# Patient Record
Sex: Male | Born: 1935 | Race: White | Hispanic: No | Marital: Married | Smoking: Never smoker
Health system: Southern US, Community
[De-identification: ages and names within clinical notes are randomized; demographics above are authoritative.]

## PROBLEM LIST (undated history)

## (undated) ENCOUNTER — Emergency Department: Disposition: A | Discharge status: Home / Self Care | Payer: Medicare Other

## (undated) DIAGNOSIS — E785 Hyperlipidemia, unspecified: Secondary | ICD-10-CM

## (undated) DIAGNOSIS — D649 Anemia, unspecified: Secondary | ICD-10-CM

## (undated) DIAGNOSIS — R296 Repeated falls: Secondary | ICD-10-CM

## (undated) DIAGNOSIS — G309 Alzheimer's disease, unspecified: Secondary | ICD-10-CM

## (undated) DIAGNOSIS — F0280 Dementia in other diseases classified elsewhere without behavioral disturbance: Secondary | ICD-10-CM

## (undated) DIAGNOSIS — I10 Essential (primary) hypertension: Secondary | ICD-10-CM

## (undated) DIAGNOSIS — C801 Malignant (primary) neoplasm, unspecified: Secondary | ICD-10-CM

## (undated) DIAGNOSIS — W19XXXA Unspecified fall, initial encounter: Secondary | ICD-10-CM

## (undated) DIAGNOSIS — F329 Major depressive disorder, single episode, unspecified: Secondary | ICD-10-CM

## (undated) HISTORY — PX: HIP SURGERY: SHX245

---

## 2006-10-06 ENCOUNTER — Ambulatory Visit: Payer: Self-pay | Admitting: Family Medicine

## 2006-10-20 ENCOUNTER — Ambulatory Visit: Payer: Self-pay | Admitting: Urology

## 2006-10-30 ENCOUNTER — Ambulatory Visit: Payer: Self-pay | Admitting: Family Medicine

## 2006-12-12 ENCOUNTER — Ambulatory Visit: Payer: Self-pay | Admitting: Urology

## 2007-10-19 ENCOUNTER — Ambulatory Visit: Payer: Self-pay | Admitting: Family Medicine

## 2011-11-15 ENCOUNTER — Inpatient Hospital Stay: Payer: Self-pay | Admitting: Internal Medicine

## 2011-11-15 LAB — URINALYSIS, COMPLETE
Bacteria: NONE SEEN
Glucose,UR: NEGATIVE mg/dL (ref 0–75)
Specific Gravity: 1.024 (ref 1.003–1.030)
Squamous Epithelial: 1
WBC UR: 12 /HPF (ref 0–5)

## 2011-11-15 LAB — COMPREHENSIVE METABOLIC PANEL
Albumin: 3.3 g/dL — ABNORMAL LOW (ref 3.4–5.0)
Alkaline Phosphatase: 115 U/L (ref 50–136)
BUN: 26 mg/dL — ABNORMAL HIGH (ref 7–18)
Calcium, Total: 8 mg/dL — ABNORMAL LOW (ref 8.5–10.1)
EGFR (African American): >60
EGFR (Non-African Amer.): >60
Osmolality: 290 (ref 275–301)
Potassium: 3.9 mmol/L (ref 3.5–5.1)
SGOT(AST): 29 U/L (ref 15–37)
Sodium: 142 mmol/L (ref 136–145)

## 2011-11-15 LAB — CBC WITH DIFFERENTIAL/PLATELET
Basophil %: 0.5 %
Eosinophil #: 0.1 10*3/uL (ref 0.0–0.7)
Eosinophil %: 2.5 %
Lymphocyte #: 0.8 10*3/uL — ABNORMAL LOW (ref 1.0–3.6)
Lymphocyte %: 16.4 %
MCV: 95 fL (ref 80–100)
Neutrophil #: 3.6 10*3/uL (ref 1.4–6.5)
Neutrophil %: 71.4 %
Platelet: 188 10*3/uL (ref 150–440)
RBC: 4.05 10*6/uL — ABNORMAL LOW (ref 4.40–5.90)
WBC: 5.1 10*3/uL (ref 3.8–10.6)

## 2011-11-15 LAB — ETHANOL
Ethanol %: <0.003 % (ref 0.000–0.080)
Ethanol: <3 mg/dL

## 2011-11-15 LAB — AMMONIA: Ammonia, Plasma: <25 mcmol/L (ref 11–32)

## 2011-11-15 LAB — TROPONIN I: Troponin-I: 0.13 ng/mL — ABNORMAL HIGH

## 2011-11-15 LAB — DRUG SCREEN, URINE
Cocaine Metabolite,Ur ~~LOC~~: NEGATIVE (ref ?–300)
Methadone, Ur Screen: NEGATIVE (ref ?–300)
Phencyclidine (PCP) Ur S: NEGATIVE (ref ?–25)

## 2011-11-15 LAB — PROTIME-INR: Prothrombin Time: 14 secs (ref 11.5–14.7)

## 2011-11-15 LAB — APTT: Activated PTT: 26 secs (ref 23.6–35.9)

## 2011-11-16 LAB — CK TOTAL AND CKMB (NOT AT ARMC)
CK, Total: 1410 U/L — ABNORMAL HIGH (ref 35–232)
CK, Total: 1758 U/L — ABNORMAL HIGH (ref 35–232)

## 2011-11-16 LAB — TROPONIN I
Troponin-I: 1.73 ng/mL — ABNORMAL HIGH
Troponin-I: 1.33 ng/mL — ABNORMAL HIGH

## 2011-11-18 LAB — CREATININE, SERUM: EGFR (African American): >60

## 2011-11-21 LAB — CULTURE, BLOOD (SINGLE)

## 2013-01-11 LAB — URINALYSIS, COMPLETE
Blood: NEGATIVE
Glucose,UR: NEGATIVE mg/dL (ref 0–75)
Ketone: NEGATIVE
Protein: NEGATIVE
RBC,UR: 15 /HPF (ref 0–5)
Specific Gravity: 1.023 (ref 1.003–1.030)
Squamous Epithelial: 1

## 2013-01-11 LAB — CBC
HCT: 42 % (ref 40.0–52.0)
HGB: 14.2 g/dL (ref 13.0–18.0)
MCH: 31.5 pg (ref 26.0–34.0)
MCHC: 33.8 g/dL (ref 32.0–36.0)
Platelet: 269 10*3/uL (ref 150–440)

## 2013-01-11 LAB — COMPREHENSIVE METABOLIC PANEL
Calcium, Total: 9.1 mg/dL (ref 8.5–10.1)
Co2: 28 mmol/L (ref 21–32)
Creatinine: 1.27 mg/dL (ref 0.60–1.30)
EGFR (Non-African Amer.): 53 — ABNORMAL LOW
Osmolality: 281 (ref 275–301)
Potassium: 3.7 mmol/L (ref 3.5–5.1)
SGOT(AST): 26 U/L (ref 15–37)
SGPT (ALT): 26 U/L (ref 12–78)
Total Protein: 6.9 g/dL (ref 6.4–8.2)

## 2013-01-11 LAB — CK TOTAL AND CKMB (NOT AT ARMC)
CK, Total: 274 U/L — ABNORMAL HIGH (ref 35–232)
CK-MB: 4.4 ng/mL — ABNORMAL HIGH (ref 0.5–3.6)

## 2013-01-12 ENCOUNTER — Inpatient Hospital Stay: Payer: Self-pay | Admitting: Specialist

## 2013-01-12 LAB — TSH: Thyroid Stimulating Horm: 1.76 u[IU]/mL

## 2013-01-14 LAB — URINE CULTURE

## 2013-01-15 LAB — CREATININE, SERUM
Creatinine: 0.91 mg/dL (ref 0.60–1.30)
EGFR (Non-African Amer.): >60

## 2013-06-27 LAB — CBC WITH DIFFERENTIAL/PLATELET
Basophil #: 0.1 10*3/uL (ref 0.0–0.1)
Eosinophil %: 1.4 %
Lymphocyte %: 10.6 %
MCH: 31.3 pg (ref 26.0–34.0)
Monocyte %: 10.5 %
Neutrophil #: 6.5 10*3/uL (ref 1.4–6.5)
WBC: 8.5 10*3/uL (ref 3.8–10.6)

## 2013-06-27 LAB — BASIC METABOLIC PANEL
Anion Gap: 8 (ref 7–16)
Calcium, Total: 9.4 mg/dL (ref 8.5–10.1)
Chloride: 104 mmol/L (ref 98–107)
Creatinine: 1.12 mg/dL (ref 0.60–1.30)
EGFR (Non-African Amer.): >60
Glucose: 105 mg/dL — ABNORMAL HIGH (ref 65–99)
Sodium: 138 mmol/L (ref 136–145)

## 2013-06-28 ENCOUNTER — Observation Stay: Payer: Self-pay | Admitting: Specialist

## 2013-06-28 LAB — URINALYSIS, COMPLETE
Bilirubin,UR: NEGATIVE
Blood: NEGATIVE
Glucose,UR: NEGATIVE mg/dL (ref 0–75)
Leukocyte Esterase: NEGATIVE
Ph: 5 (ref 4.5–8.0)
RBC,UR: 2 /HPF (ref 0–5)
Specific Gravity: 1.026 (ref 1.003–1.030)
WBC UR: 5 /HPF (ref 0–5)

## 2013-06-28 LAB — HEPATIC FUNCTION PANEL A (ARMC)
Albumin: 4.4 g/dL
Alkaline Phosphatase: 226 U/L — ABNORMAL HIGH
Bilirubin, Direct: 0.2 mg/dL
Bilirubin,Total: 0.8 mg/dL
SGOT(AST): 30 U/L
SGPT (ALT): 29 U/L
Total Protein: 7.9 g/dL

## 2013-06-29 LAB — URINE CULTURE

## 2014-06-19 LAB — COMPREHENSIVE METABOLIC PANEL
ALBUMIN: 3.3 g/dL — AB (ref 3.4–5.0)
ANION GAP: 5 — AB (ref 7–16)
AST: 30 U/L (ref 15–37)
Alkaline Phosphatase: 180 U/L — ABNORMAL HIGH
BUN: 22 mg/dL — AB (ref 7–18)
Bilirubin,Total: 0.9 mg/dL (ref 0.2–1.0)
Calcium, Total: 8.4 mg/dL — ABNORMAL LOW (ref 8.5–10.1)
Chloride: 108 mmol/L — ABNORMAL HIGH (ref 98–107)
Co2: 27 mmol/L (ref 21–32)
Creatinine: 1.15 mg/dL (ref 0.60–1.30)
EGFR (Non-African Amer.): >60
Glucose: 122 mg/dL — ABNORMAL HIGH (ref 65–99)
Osmolality: 284 (ref 275–301)
POTASSIUM: 4.4 mmol/L (ref 3.5–5.1)
SGPT (ALT): 25 U/L
Sodium: 140 mmol/L (ref 136–145)
Total Protein: 6.7 g/dL (ref 6.4–8.2)

## 2014-06-19 LAB — CBC
HCT: 40 % (ref 40.0–52.0)
HGB: 13.3 g/dL (ref 13.0–18.0)
MCH: 32.2 pg (ref 26.0–34.0)
MCHC: 33.2 g/dL (ref 32.0–36.0)
MCV: 97 fL (ref 80–100)
Platelet: 229 10*3/uL (ref 150–440)
RBC: 4.13 10*6/uL — ABNORMAL LOW (ref 4.40–5.90)
RDW: 13.5 % (ref 11.5–14.5)
WBC: 9.7 10*3/uL (ref 3.8–10.6)

## 2014-06-19 LAB — URINALYSIS, COMPLETE
BACTERIA: NONE SEEN
BILIRUBIN, UR: NEGATIVE
BLOOD: NEGATIVE
Glucose,UR: NEGATIVE mg/dL (ref 0–75)
NITRITE: NEGATIVE
Ph: 5 (ref 4.5–8.0)
Protein: 30
SPECIFIC GRAVITY: 1.024 (ref 1.003–1.030)
Squamous Epithelial: 1
WBC UR: 154 /HPF (ref 0–5)

## 2014-06-19 LAB — TROPONIN I

## 2014-06-19 LAB — LIPASE, BLOOD: Lipase: 154 U/L (ref 73–393)

## 2014-06-20 ENCOUNTER — Observation Stay: Payer: Self-pay | Admitting: Internal Medicine

## 2014-06-21 LAB — BASIC METABOLIC PANEL
Anion Gap: 7 (ref 7–16)
BUN: 19 mg/dL — ABNORMAL HIGH (ref 7–18)
Calcium, Total: 7.7 mg/dL — ABNORMAL LOW (ref 8.5–10.1)
Chloride: 110 mmol/L — ABNORMAL HIGH (ref 98–107)
Co2: 25 mmol/L (ref 21–32)
Creatinine: 0.98 mg/dL (ref 0.60–1.30)
EGFR (African American): >60
EGFR (Non-African Amer.): >60
Glucose: 77 mg/dL (ref 65–99)
Osmolality: 284 (ref 275–301)
Potassium: 3.9 mmol/L (ref 3.5–5.1)
Sodium: 142 mmol/L (ref 136–145)

## 2014-06-21 LAB — TSH: Thyroid Stimulating Horm: 4.25 u[IU]/mL

## 2014-06-21 LAB — URINE CULTURE

## 2014-07-10 ENCOUNTER — Ambulatory Visit: Payer: Self-pay | Admitting: Family Medicine

## 2014-07-11 ENCOUNTER — Emergency Department: Payer: Self-pay | Admitting: Emergency Medicine

## 2014-07-11 LAB — CBC WITH DIFFERENTIAL/PLATELET
BASOS PCT: 3.4 %
Basophil #: 0.2 10*3/uL — ABNORMAL HIGH (ref 0.0–0.1)
Eosinophil #: 0.1 10*3/uL (ref 0.0–0.7)
Eosinophil %: 2.3 %
HCT: 38.1 % — ABNORMAL LOW (ref 40.0–52.0)
HGB: 12.2 g/dL — ABNORMAL LOW (ref 13.0–18.0)
LYMPHS PCT: 13.2 %
Lymphocyte #: 0.7 10*3/uL — ABNORMAL LOW (ref 1.0–3.6)
MCH: 31.5 pg (ref 26.0–34.0)
MCHC: 31.9 g/dL — ABNORMAL LOW (ref 32.0–36.0)
MCV: 99 fL (ref 80–100)
Monocyte #: 0.6 x10 3/mm (ref 0.2–1.0)
Monocyte %: 10.6 %
NEUTROS PCT: 70.5 %
Neutrophil #: 3.8 10*3/uL (ref 1.4–6.5)
Platelet: 293 10*3/uL (ref 150–440)
RBC: 3.86 10*6/uL — ABNORMAL LOW (ref 4.40–5.90)
RDW: 14.1 % (ref 11.5–14.5)
WBC: 5.4 10*3/uL (ref 3.8–10.6)

## 2014-07-12 DIAGNOSIS — L03116 Cellulitis of left lower limb: Secondary | ICD-10-CM | POA: Insufficient documentation

## 2014-07-15 LAB — WOUND CULTURE

## 2014-07-17 ENCOUNTER — Encounter: Payer: Self-pay | Admitting: Surgery

## 2014-10-18 ENCOUNTER — Inpatient Hospital Stay: Payer: Self-pay | Admitting: Internal Medicine

## 2014-10-28 ENCOUNTER — Encounter
Admit: 2014-10-28 | Disposition: A | Discharge status: Home / Self Care | Payer: Self-pay | Attending: Surgery | Admitting: Surgery

## 2014-11-07 ENCOUNTER — Encounter
Admit: 2014-11-07 | Disposition: A | Discharge status: Home / Self Care | Payer: Self-pay | Attending: Surgery | Admitting: Surgery

## 2014-11-28 NOTE — Discharge Summary (Signed)
PATIENT NAME:  Kenneth Knight, Kenneth Knight MR#:  [id number] DATE OF BIRTH:  Aug 06, 1932  DATE OF ADMISSION:  01/12/2013 DATE OF DISCHARGE:  01/15/2013  For a detailed note, please take a look at the history and physical done on admission by Dr. Lenore Manner.   DISCHARGE DIAGNOSES:  1.  Altered mental status secondary to metabolic encephalopathy from urinary tract infection with underlying dementia.  2.  Urinary tract infection secondary to Escherichia coli. 3.  Dementia.  4.  Hypertension.   DIET: The patient is being discharged on a low-sodium diet.   ACTIVITY: As tolerated.   DISCHARGE FOLLOWUP:  With Dr. Jarome Lamas in the next 1 to 2 weeks.    DISCHARGE MEDICATIONS: 1.  Aricept 10 mg at bedtime. 2.  Remeron 30 mg at bedtime. 3.  Amlodipine 10 mg daily. 4.  Ceftin 250 mg b.i.d. x 5 days.   PERTINENT STUDIES:  During the hospital course are as follows: A CT scan of the head done without contrast on admission showing no acute intracranial process. A chest x-ray done on admission showing no evidence of acute cardiopulmonary disease. A urine culture to be positive for E. coli.   HOSPITAL COURSE: This is a 81 year old male with medical problems as mentioned above who presented to the hospital secondary to altered mental status and weakness.  1.  Altered mental status. Mostly cause of this was metabolic encephalopathy secondary to the UTI complicated with underlying dementia. The patient was hydrated with V fluids, given IV ceftriaxone for the UTI and the patient's mental status has significantly increased and now back to baseline. Because of his generalized weakness a physical therapy consult was obtained. After their evaluation they thought he would benefit from short-term rehab, which is where he is currently being discharged to.  2.  Urinary tract infection. This was secondary to E. coli as the urine culture indicated that. The patient was initially treated with IV ceftriaxone and is currently being  discharged on p.o. Ceftin for the next few days. 3.  Dementia. As mentioned, the patient's mental status is now back to baseline. He will continue his Aricept and his Remeron.  4.  Hypertension. The patient was maintained on amlodipine and will be discharged on that. Presently hemodynamically stable.   The patient is a FULL CODE.   TIME SPENT ON DISCHARGE:  40 minutes. ____________________________ Belia Heman. Verdell Carmine, MD vjs:sb D: 01/15/2013 13:36:26 ET T: 01/15/2013 13:55:47 ET JOB#: [id number]  cc: Belia Heman. Verdell Carmine, MD, <Dictator> Irven Easterly. Kary Kos, MD Henreitta Leber MD ELECTRONICALLY SIGNED 01/25/2013 20:35

## 2014-11-28 NOTE — H&P (Signed)
PATIENT NAME:  Kenneth Knight, Kenneth Knight MR#:  [id number] DATE OF BIRTH:  1932/03/05  DATE OF ADMISSION:  01/12/2013  PRIMARY CARE PHYSICIAN:  The patient does not specify.  He says a bunch of doctors.  However, according to records last admission his primary care physician was Dr. Maryland Pink.   REFERRING PHYSICIAN:  Dr. Marjean Donna.   CHIEF COMPLAINT:  Altered mental status, generalized weakness and grade fever.   HISTORY OF PRESENT ILLNESS:  Kenneth Knight is a 81 year old Caucasian male with history of dementia, last admission to this hospital was a year ago when he came with altered mental status and unresponsiveness.  He was also found to have urinary tract infection.  The patient has baseline dementia.  He lives with his wife who has multiple sclerosis.  The family noticed that his mental status is altered.  He is more lethargic, not himself, more confused, tendency to fall.  He has some suprapubic pain and dysuria.  The family cannot take care of him when he is in this condition.  He was brought to the hospital for evaluation and his work-up here shows evidence of urinary tract infection.  He also has low-grade fever reaching 99.2.  The patient was admitted to the hospital for further evaluation and treatment.   REVIEW OF SYSTEMS:  Difficult historian, does not give clear answers due to his altered mental status and his baseline dementia.  He admits having some dysuria, however a 10 point system review was difficult to obtain.   PAST MEDICAL HISTORY:  Last admission here was in April 2013 with decreased responsiveness and underlying urinary tract infection.  The patient also has dementia and depression.  History of prostatic cancer.   PAST SURGICAL HISTORY:  Right hip replacement and ankle surgery.   SOCIAL HABITS:  Nonsmoker.  No history of alcohol or drug abuse according to the records.   SOCIAL HISTORY:  He lives with his wife who suffers from multiple sclerosis.   FAMILY HISTORY:   Unobtainable from the patient, but according to the records his mother had stomach cancer and his father suffered from hypertension and stroke.   ADMISSION MEDICATIONS:  Donepezil 10 mg a day and Remeron 30 mg at bedtime.   PHYSICAL EXAMINATION: VITAL SIGNS:  Blood pressure 167/79, respiratory rate 20, pulse 87, temperature 99.2, O2 saturation was 96%.  GENERAL APPEARANCE:  Elderly male lying in bed in no acute distress.  He looks sleepy and slightly lethargic.  HEAD AND NECK:  No pallor.  No icterus.  No cyanosis.  EARS, NOSE, THROAT:  Ear examination revealed normal hearing, no discharge, no lesions.  Examination of the nose showed normal findings without ulcers.  No discharge, no bleeding.  Oropharyngeal examination revealed normal lips and tongue.  No oral thrush, no ulcers.  He had lost most of his teeth in the upper jaw.  EYES:  Revealed normal eyelids and conjunctivae.  Both pupils are constricted.  I could not see reactivity to light.  However, both pupils are equal in size.  NECK:  Supple.  Trachea at midline.  No thyromegaly.  No cervical lymphadenopathy.  No masses.  HEART:  Normal S1, S2.  No S3, S4.  No murmur.  No gallop.  No carotid bruits.  RESPIRATORY:  Revealed normal breathing pattern without use of accessory muscles.  No rales.  No wheezing.  ABDOMEN:  Soft without tenderness.  No suprapubic tenderness as well.  No rebound.  No rigidity.  No hepatosplenomegaly.  No hernias.  SKIN:  Revealed no ulcers.  No subcutaneous nodules.  MUSCULOSKELETAL:  No joint swelling.  No clubbing.  NEUROLOGIC:  Cranial nerves II through XII are intact.  No focal motor deficit.  PSYCHIATRIC:  The patient appears slightly confused.  He does not give me a straight answer about orientation to the place and to the time.  Mood and affect, he appears calm and cooperative.   LABORATORY FINDINGS:  CAT scan of the head showed no acute intracranial process.  EKG showed baseline artifact with a regular  rhythm, make it appear as if it is atrial fibrillation, however there are some P waves in V1, therefore I interpret the EKG as sinus tachycardia with frequent atrial premature complexes.  The heart rate is 101 per minute.  Incomplete right bundle branch block.  Nonspecific T wave abnormalities.  His serum glucose 149, BUN 20, creatinine 1.2, sodium 138, potassium 3.7.  His liver function tests were normal with a slight elevation of alkaline phosphatase at 213.  CPK total elevated at 274.  Normal troponin, less than 0.02.  CBC showed white count of 8000, hemoglobin 14, hematocrit 42, platelet count 269.  Urinalysis showed 44 white blood cells, +3 leukocyte esterase, +3 bacteria.   ASSESSMENT: 1.  Altered mental status.  2.  Early acute pyelonephritis complicating acute cystitis.  3.  Dementia.  4.  Mild rhabdomyolysis likely from his fall.  5.  Noticed elevated blood pressure reaching 268 systolic.  This needs to be monitored as the patient may have underlying hypertension.   PLAN:  We will admit the patient to the medical floor with frequent neurologic examination.  IV fluids and IV antibiotic using Rocephin.  Urine for culture and sensitivity.  I will check a TSH to make sure that he does not have underlying hypothyroidism.  Continue home medications.  Lovenox 40 mg subcutaneous once a day for deep vein thrombosis prophylaxis.  We will continue to monitor patient's response to the treatment.   Time spent in evaluating this patient took more than 55 minutes and this also includes reviewing his medical records.    ____________________________ Clovis Pu. Lenore Manner, MD amd:ea D: 01/12/2013 00:50:51 ET T: 01/12/2013 01:49:13 ET JOB#: [id number]  cc: Clovis Pu. Lenore Manner, MD, <Dictator> Mike Craze Irven Coe MD ELECTRONICALLY SIGNED 01/25/2013 3:14

## 2014-11-28 NOTE — Discharge Summary (Signed)
PATIENT NAME:  Kenneth Knight, Kenneth Knight MR#:  [id number] DATE OF BIRTH:  January 09, 1932  DATE OF ADMISSION:  06/28/2013 DATE OF DISCHARGE:  06/29/2013  For a detailed note, please take a look at the history and physical done on admission by Dr. Bridgette Habermann.     DIAGNOSES AT DISCHARGE: Is as follows: 1.  Generalized weakness and frequent falls secondary to underlying dementia.  2.  Dementia.  3.  Asymptomatic urinary tract infection.  4.  Hypertension.   DIET: The patient is being discharged on a low-sodium diet.   ACTIVITY: As tolerated.   FOLLOWUP: With Dr. Jarome Lamas next 1 to 2 weeks.  The patient is being discharged with home health, physical therapy and nursing services.   DISCHARGE MEDICATIONS: Aricept 10 mg at bedtime, Remeron 30 mg at bedtime, amlodipine 10 mg daily, and ciprofloxacin 250 mg b.i.d. x 3 days.   PERTINENT STUDIES DONE DURING THE HOSPITAL COURSE: Are as follows: A CT scan of the head done without contrast showing no acute intracranial process. A chest x-ray done on admission showing no acute cardiopulmonary disease. X-ray of the left hip showing severe osteoarthritis of the left hip. No evidence of any fracture. An x-ray of the lumbar spine showing no acute fracture. Multilevel degenerative disk disease. Most severe at L2 and L3 level.   HOSPITAL COURSE: This is a 81 year old male who presented to the hospital due to frequent falls and difficulty ambulating.   PROBLEMS: 1.  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  The likely cause of the patient's falls were a combination of dementia with severe DJD. The patient was evaluated by physical therapy and they recommended short-term rehab, although the patient was under observation and did not qualify to be placed in rehab. At this point, the patient is being discharged home with home health physical therapy and nursing services and this is to be further addressed by his primary care physician as an outpatient.  2.  Asymptomatic  urinary tract infection. The patient's urinalysis was mildly positive. He was placed on oral ciprofloxacin. He will finish treatment for the urinary tract infection.  3.  Dementia. The patient was maintained on his Aricept. He will resume that.  4.  Hypertension. The patient remained hemodynamically stable. He will continue his amlodipine at discharge.   CODE STATUS: The patient is a FULL CODE.   DISPOSITION: He is being discharged home with home health physical therapy and nursing services.   TIME SPENT: 40 minutes.  ____________________________ Belia Heman. Verdell Carmine, MD vjs:dp D: 06/29/2013 12:48:44 ET T: 06/29/2013 13:34:54 ET JOB#: [id number]  cc: Belia Heman. Verdell Carmine, MD, <Dictator> Henreitta Leber MD ELECTRONICALLY SIGNED 07/13/2013 18:33

## 2014-11-28 NOTE — H&P (Signed)
PATIENT NAME:  Kenneth Knight, Kenneth Knight MR#:  [id number] DATE OF BIRTH:  13-Apr-1932  DATE OF ADMISSION:  06/27/2013  REFERRING PHYSICIAN: Verdia Kuba. Paduchowski, MD  PRIMARY CARE PHYSICIAN: Dr. Kary Kos.   CHIEF COMPLAINT: Brought in by family for recurrent falls and weakness.   HISTORY OF PRESENT ILLNESS: The patient is a pleasant 81 year old male with history of dementia, hypertension, recurrent UTIs, who was brought in by family after the patient has been noted to have increased weakness and several falls. The patient usually is able to ambulate, but recently has been having decreased ambulation, and the patient has been having some falls. Unfortunately, there is no family at bedside. Given the dementia, the patient is not a reliable historian. The patient denies having any fevers or chills, but states that he has some dysuria and some leg pain. While in the ER, he had left hip complete x-rays which shows no fractures.  He also had lumbar spine. Again, there are no fractures, but there is degenerative disease. There is no fever and there is no leukocytosis. Hospitalist services were contacted for further evaluation and management.   PAST MEDICAL HISTORY: Per chart; dementia, prostate cancer, depression, hypertension, multiple UTIs in the past.    SURGICAL HISTORY: Right hip replacement, ankle surgery.   SOCIAL HISTORY: Per chart, no smoking, no alcohol or drug use. Lives with his daughter.    OUTPATIENT MEDICATIONS: Unable to obtain, as patient does not know.  REVIEW OF SYSTEMS:  Unable to fully obtain given the patient's dementia.   PHYSICAL EXAMINATION: VITAL SIGNS: Temperature on arrival 98.2, pulse rate 80, respiratory rate 20, blood pressure 135/67, O2 sat 96% on room air.  GENERAL: The patient is an elderly male lying in bed, no obvious distress.   HEENT: Normocephalic, atraumatic. Pupils are equal and reactive. Anicteric sclerae, extraocular  muscles intact. Moist mucous membranes.  NECK:  Supple. No thyroid tenderness. No cervical lymphadenopathy.  CARDIOVASCULAR: S1, S2, irregularly irregular. No murmurs, rubs or gallops.  LUNGS: Clear to auscultation without wheezing, rhonchi or rales. The patient has poor effort.  ABDOMEN: Soft, nontender, nondistended. Positive bowel sounds in all quadrants.  EXTREMITIES: No significant lower extremity edema.  SKIN: The patient has an area of small bruising and ecchymosis in the left flank area.  NEUROLOGIC: The patient appears to have cranial nerves Ii through XII grossly intact.  Strength is 5 out of 5 in all extremities. Sensation is subjectively intact to light touch.  PSYCHIATRIC: Awake, awake, alert, oriented to the hospital but not to date, person.  SKIN: No obvious rashes otherwise.  ;LABS AND IMAGING:  Left hip complete' severe osteoarthritis of the left hip. No left-sided pelvic fracture or left hip fracture, extensive right hemi-pelvic deformity related to prior fractures and reconstruction. Urinalysis: The patient does have 5 WBC, trace bacteria, no nitrites or leukocyte esterase. No white count, hemoglobin and platelets are stable. Troponins negative. LFTs show alk phos of 226, otherwise within normal limits. BUN 24, creatinine 1.12, sodium 138, potassium 4.3.   EKG: Normal sinus rhythm, incomplete right bundle branch block, some nonspecific ST-T wave changes. No acute ST elevations or depressions.   ASSESSMENT AND PLAN: We have a 81 year old with dementia, hypertension, multiple urinary tract infections who comes in with frequent falls and weakness. The patient has no significant lab abnormalities, but has some subjective dysuria. This could be urinary tract infection, which the patient has remote history of multiple urinary tract infections in the past. The patient has trace bacteria. We would  follow with a urine culture and start the patient on ceftriaxone for now, start the patient on some gentle fluids and obtain a physical therapy  consult. Bring him in for observation overnight. The patient has no significant fever or leukocytosis, otherwise. X-rays of the left hip and pelvis does not show any acute fractures. Will start the patient on some Tylenol, heparin for deep vein thrombosis prophylaxis and pantoprazole. We would confirm outpatient medications before resuming them. Would put him on sodium restricted diet for now, put him on telemetry to look for any significant arrhythmias, if any.  The patient is a Full Code for now, as we cannot determine CODE STATUS.          Total Time Spent: 35 minutes.    ____________________________ Vivien Presto, MD sa:NTS D: 06/28/2013 04:28:25 ET T: 06/28/2013 04:54:02 ET JOB#: [id number]  cc: Vivien Presto, MD, <Dictator> Vivien Presto MD ELECTRONICALLY SIGNED 07/15/2013 20:00

## 2014-11-28 NOTE — Discharge Summary (Signed)
PATIENT NAME:  Kenneth Knight, Kenneth Knight MR#:  [id number] DATE OF BIRTH:  October 19, 1931  DATE OF ADMISSION:  06/28/2013 DATE OF DISCHARGE:  06/30/2013  ADDENDUM:  Please take a look at the detailed discharge summary done by me on November 22nd. This is just a quick addendum to the previous discharge summary. The patient was admitted to the hospital for frequent falls. The patient was evaluated by physical therapy and they did not think the patient needed short-term rehab. He was, therefore, discharged home with home health services. This was discussed with the patient's daughter and she was in agreement. The patient had an incidental asymptomatic urinary tract infection. He was discharged on oral ciprofloxacin for that. The patient was discharged with home health services as mentioned. This is a quick addendum to the discharge summary done by me on November 22nd.   TIME SPENT: 25 minutes.  ____________________________ Belia Heman. Verdell Carmine, MD vjs:cs D: 07/14/2013 12:57:32 ET T: 07/14/2013 20:19:09 ET JOB#: [id number]  cc: Belia Heman. Verdell Carmine, MD, <Dictator> Henreitta Leber MD ELECTRONICALLY SIGNED 07/18/2013 14:30

## 2014-11-29 NOTE — Discharge Summary (Signed)
PATIENT NAME:  Kenneth Knight, Kenneth Knight MR#:  [id number] DATE OF BIRTH:  [birth year]  DATE OF ADMISSION:  06/20/2014 DATE OF DISCHARGE:  06/22/2014  DISCHARGE DIAGNOSES:  1.  Multiple falls are due to ambulatory dysfunction.  2.  Dementia.  3.  Hypertension.  4   DISCHARGE MEDICATIONS:  1.  Aricept 10 mg daily.  2.  Mirtazapine30 mg at bedtime.  3.  Amlodipine 10 mg p.o. daily. 4.  Lexapro 500 mg p.o. b.i.d.  5.  The patient given Cipro for about 5 days.  DISCHARGE DISPOSITION: Discharged home with home health.   HOSPITAL COURSE: An 81 year old male patient brought in because of multiple falls for about 6 times in the last 2 days. Head CT was unremarkable. The patient has severe dementia and he was taking Aricept and mirtazapine at home and on the admission day, his urine was positive for leukocyte esterase and 154 WBC.  The patient's vitals were normal except blood pressure was slightly low at 97/57.  So, he was admitted for ambulatory dysfunction, urinary tract infection. He was given IV fluids for his hypotension.  The patient was seen by physical therapy who recommended home physical therapy.  The patient received IV fluids and IV antibiotics.  The patient was placed on observation status.  Urine culture did not show any growth.  His kidney function was normal. White count was normal. The patient was discharged home with home health and CBC was normal and electrolytes were normal.    PHYSICAL EXAMINATION: On the day of discharge: CARDIOVASCULAR:   S1, S2 regular.  LUNGS: Clear to auscultation. NEUROLOGICAL:  The patient's mental status; he was awake and oriented at times but sometimes he feels he has some memory gaps. EXTREMITIES: No extremity edema. VITAL SIGNS:  Temperature 97.7, heart rate 71, blood pressure 113/71, saturations 99% on room air.   TIME SPENT: More than 30 minutes.     PRIMARY CARE PHYSICIAN:  Lovie Macadamia, MD      ____________________________ Epifanio Lesches,  MD sk:DT D: 06/27/2014 14:01:20 ET T: 06/27/2014 16:05:30 ET JOB#: [id number]  cc: Epifanio Lesches, MD, <Dictator> _____ Irven Easterly. Kary Kos, MD    Epifanio Lesches MD ELECTRONICALLY SIGNED 07/07/2014 17:43
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
## 2014-11-30 NOTE — H&P (Signed)
PATIENT NAME:  Kenneth Knight, FIELDHOUSE MR#:  [id number] DATE OF BIRTH:  [birth year]  DATE OF ADMISSION:  11/15/2011  PRIMARY CARE PHYSICIAN: Dr. Kary Kos   CHIEF COMPLAINT: Unresponsiveness and syncope with collapse.   HISTORY OF PRESENT ILLNESS: This is a 81 year old male brought in by EMS as he was found unresponsive and also due to syncope. Patient himself has underlying dementia therefore most of the history obtained from the son at bedside. As per the son, patient was awake and alert and inside the house when he left around 1:00 this afternoon. When he came back a little after 3:00 his mother told him that his father was outside unresponsive. Son tried to arouse his father although he would not wake up and therefore he called EMS. Upon EMS arrival patient was still unresponsive. Patient was then urgently brought to the ER for further evaluation. When patient presented to triage his temperature rectally was as high as 104 although he was hemodynamically stable. Patient after receiving some IV fluids and being on a cooling blanket his mental status has improved but still remains somewhat lethargic and groggy. He was noted to have a slightly elevated troponin. Hospitalist service was then contacted for further treatment and evaluation.   REVIEW OF SYSTEMS: Currently unobtainable given the patient's underlying dementia.   PAST MEDICAL HISTORY:  1. Osteoarthritis. 2. Dementia. 3. History of prostate cancer.  4. Depression.   ALLERGIES: No known drug allergies.   SOCIAL HISTORY: No smoking. No alcohol abuse. No illicit drug abuse. Lives with his wife.   FAMILY HISTORY: Father with hypertension and stroke. Mother with stomach cancer.   PAST SURGICAL HISTORY:  1. Right hip replacement. 2. Ankle surgery.   CURRENT MEDICATION: Remeron 30 mg at bedtime.   PHYSICAL EXAMINATION:  VITAL SIGNS: Patient's vital signs are noted to be: Temperature rectally was initially 104, now it is 98, pulse 105  respirations 22, blood pressure 144/69, sats 96% on 2 liters nasal cannula.   GENERAL: He is a lethargic-appearing male but in no apparent distress.   HEENT: He is atraumatic, normocephalic. His extraocular muscles are intact. Pupils equal, reactive to light. Sclerae anicteric. No conjunctival injection. No pharyngeal erythema.   NECK: Supple. No jugular venous distention. No bruits. No lymphadenopathy. No thyromegaly.   HEART: Tachycardic, regular. No murmurs, no rubs, no clicks.   LUNGS: Clear to auscultation bilaterally. No rales, no rhonchi, no wheezes.   ABDOMEN: Soft, flat, nontender, nondistended. Has good bowel sounds. No hepatosplenomegaly appreciated.   EXTREMITIES: No evidence of any cyanosis, clubbing, or peripheral edema. Has +2 pedal and radial pulses bilaterally.   NEUROLOGIC: He is alert, awake, oriented x1 otherwise moves all extremities spontaneously. He is globally weak. Difficult to do a full neurological exam given his baseline underlying dementia.   SKIN: Moist, warm. He does have an area on the right side of his knee which is somewhat red but not tender to touch. Likely secondary to the pressure he had when he was lying on the ground.    LYMPHATIC: There is no cervical or axillary lymphadenopathy.   LABORATORY, DIAGNOSTIC, AND RADIOLOGICAL DATA: Serum glucose 139, BUN 26, creatinine 1.2, sodium 142, potassium 3.9, chloride 110, bicarbonate 23. LFTs are within normal limits. Troponin 0.13. Urine toxicology negative. White cell count 5.1, hemoglobin 12.9, hematocrit 38.6, platelet count 188, prothrombin time 14.1, INR 1. Urinalysis showed leukocyte esterase +1 with 12 white cells but no bacteria seen. Patient did have a chest x-ray done which showed no acute cardiopulmonary  disease. He also had a CT scan of the head and CT scan of the cervical spine without contrast showing no evidence of acute intracranial process, only fracture.   ASSESSMENT AND PLAN: This is a  81 year old male with past medical history of dementia, osteoarthritis, depression and prostate cancer presents to the hospital as he was found unresponsive and also noted to be hyperthermic.  1. Unresponsiveness/questionable syncope with collapse. The syncope was actually not witnessed but he was found down. He was probably down for about an hour as per the son. No evidence of seizure-type activity. No postictal state. His CT scans of the cervical spine and head are negative. There is no obvious trauma noted on physical exam. I suspect this could possibly be cardiogenic syncope given his elevated troponin. I will go ahead and observe him on telemetry. Follow serial cardiac markers. Get a two-dimensional echo, also a carotid duplex. Will also check orthostatics. Will also continue IV fluids for now.  2. Hyperthermia. This is probably related to the fact that he was found down while he was outside in the sun. His temperature has significantly improved with the cooling blanket and IV fluids. Will follow his fever curve. Do not suspect an infectious source.  3. Elevated troponin. The patient currently is chest pain free but is a poor historian. He has no EKG changes. I will cycle his cardiac markers and observe him on tele and get a two-dimensional echo.  4. Urinary tract infection. Based on the urinalysis this is a slightly positive urinalysis. I will go ahead and empirically start him on p.o. Cipro for now.  5. Dementia with depression. Based on previous notes from the [facility] by Dr. Kary Kos looks like patient has been having balance problems and frequent falls. I will go ahead and get a physical therapy consult to assess his mobility.  6. CODE STATUS: Patient is a FULL CODE.   TIME SPENT: 50 minutes.  ____________________________ Belia Heman. Verdell Carmine, MD vjs:cms D: 11/15/2011 20:00:53 ET T: 11/16/2011 06:00:05 ET JOB#: [id number]  cc: Belia Heman. Verdell Carmine, MD, <Dictator> Irven Easterly. Kary Kos, MD Henreitta Leber MD ELECTRONICALLY SIGNED 11/18/2011 14:49

## 2014-11-30 NOTE — Discharge Summary (Signed)
PATIENT NAME:  Kenneth Knight, Kenneth Knight MR#:  [id number] DATE OF BIRTH:  1932-03-01  DATE OF ADMISSION:  11/15/2011 DATE OF DISCHARGE:  11/18/2011  DISCHARGE DIAGNOSES:  1. Unresponsiveness/possible syncope with collapse of unknown etiology with negative cardiac work-up and Myoview been negative.  2. Hyperthermia, resolved, likely related to being found down for awhile in the sun. Temperature improved with cooling blanket.  3. Elevated troponin could be due to supply/demand ischemia.  4. Urinary tract infection, treated with antibiotics.  5. Dementia/depression. Based on Dr. Barbarann Ehlers note, the patient has been having frequent falls at home.  6. Physical therapy recommended home health.   SECONDARY DIAGNOSES:  1. Osteoarthritis.  2. Dementia.  3. History of prostate cancer.  4. Depression.   CONSULTATIONS:  1. Physical Therapy. 2. Serafina Royals, MD - Cardiology.  PROCEDURES/DATA: Myoview on 11/18/2011 was normal, ejection fraction of 61%.   Bilateral carotid Doppler's on 11/16/2011 showed no hemodynamically significant stenosis. Antegrade flow in both vertebrals.   2-D echocardiogram on 11/16/2011 showed normal LV systolic function. Ejection fraction of 65%. Moderately dilated left atrium. Mild to moderately dilated right atrium. Mild to moderate mitral regurgitation. Mild tricuspid regurgitation. Mild aortic root dilatation.   CT scan of the head without contrast, on 11/15/2011, showed no acute intracranial process.   CT scan of cervical spine without contrast on 11/15/2011 showed no acute osseous injury of the cervical spine.   Chest x-ray on 11/15/2011 showed no acute cardiopulmonary disease.   MAJOR LABORATORY PANEL: Urinalysis on admission showed 12 WBCs, 1+ leukocyte esterase, and no bacteria.   Blood cultures x2 were negative.   HISTORY AND SHORT HOSPITAL COURSE: The patient is a 81 year old male with the above-mentioned medical problems who was admitted for  unresponsiveness/possible syncope with collapse. He underwent complete neurological evaluation including CT scan of the head and cervical spine, carotid Dopplers, and 2-D echo which were all within normal limits. He was found to have an elevated troponin which was thought to be due to supply/demand ischemia. He was treated with ciprofloxacin for urinary tract infection. Please see Dr. Edward Jolly dictated history and physical for further details. Physical therapy consultation was obtained as the patient was found to have recurrent fall at home and was recommended home health. The patient was also evaluated by cardiology, Dr. Nehemiah Massed, who recommended Myoview, which was obtained and was negative. The patient ambulated well. He was also found to have some hypothermia on admission which was resolved while in the hospital. His discharge plan was discussed with the patient's family (his daughter) who was agreeable with the discharge plan.   PERTINENT PHYSICAL EXAMINATION:   VITAL SIGNS: On the date of discharge, his temperature was 98.4, heart rate 87 per minute, respirations 20 per minute, blood pressure 117/72 mmHg, and he was saturating 93% on room air.   CARDIOVASCULAR: S1 and S2 normal. No murmurs, rubs, or gallops.   LUNGS: Clear to auscultation bilaterally. No wheezing, rales, rhonchi, or crepitation.   ABDOMEN: Soft, benign.   NEUROLOGIC: Nonfocal examination. All other physical examination remained at baseline.   DISCHARGE MEDICATIONS:  1. Donepezil 10 mg p.o. at bedtime.  2. Mirtazapine 30 mg p.o. at bedtime.  3. Metoprolol 25 mg p.o. daily.  4. Aspirin 81 mg p.o. daily.   DISCHARGE DIET: Low sodium.   DISCHARGE ACTIVITY: As tolerated.   DISCHARGE INSTRUCTIONS AND FOLLOW-UP: The patient was instructed to follow-up with his primary care physician, Dr. Kary Kos, in 1 to 2 weeks, with cardiology, Dr. Nehemiah Massed, in 2 to  3 weeks, and with [facility] in 2 to 4 weeks. He will get  home health with physical therapy and nursing and nursing aide which has been set up by care management.   TOTAL TIME DISCHARGING THIS PATIENT: 55 minutes. ____________________________ Lucina Mellow. Manuella Ghazi, MD vss:slb D: 11/22/2011 11:02:08 ET T: 11/22/2011 17:31:06 ET JOB#: [id number]  cc: Daylene Vandenbosch S. Manuella Ghazi, MD, <Dictator> Irven Easterly. Kary Kos, MD Corey Skains, MD [facility] Lucina Mellow [facility] MD ELECTRONICALLY SIGNED 11/23/2011 9:14

## 2014-11-30 NOTE — Consult Note (Signed)
General Aspect This is a 81 yo male with h/o dementia, arthritis, depression, frequent falls that is being evaluated for positive troponin and syncope. Pt collasped in his yard on concrete and was unresponsive and exposed to the sun for around an hour. He had hyperthermia with fever of 104 degrees on arrival to ER. He temp did normalize with a cooling blanket. Troponin peached at1.73. Echo with normal ef, atria enlargement and valvular insufficiency. EKG revealed IRBBB with pc's. No acute changes. BNP elevated without clinical signs of heart failure. Pt is demented but  contibuted to the interview appropriately with most of the history from the daughter. No history of cad, no recent chest pain or dyspnea. Nsr on monitor without arrythmia. No bp meds on board at home to explain a drop in pressure. Mild UTI found.   Physical Exam:   GEN well developed, no acute distress    HEENT pink conjunctivae    NECK supple    RESP normal resp effort  clear BS    CARD Regular rate and rhythm    ABD denies tenderness  normal BS    EXTR negative edema    SKIN skin turgor decreased    NEURO cranial nerves intact, motor/sensory function intact    PSYCH alert   Review of Systems:   Subjective/Chief Complaint Syncope    Psychiatric: h/o dementia    Review of Systems: All other systems were reviewed and found to be negative   Lab:  09-Apr-13 17:10    pH (ABG) 7.40   PCO2 36   PO2 66   FiO2 28   Base Excess -2.0   HCO3 22.3   O2 Saturation 95.8   Patient Temp (ABG) 37.0   Radiology Results: XRay:    09-Apr-13 17:12, Chest Portable Single View   Chest Portable Single View    REASON FOR EXAM:    altered mental status, hyperthermia  COMMENTS:       PROCEDURE: DXR - DXR PORTABLE CHEST SINGLE VIEW  - Nov 15 2011  5:12PM     RESULT: Comparison: None    Findings:     Single portable AP chest radiograph is provided. There are low lung   volumes. There bilateral prominent interstitial  markings likely secondary   to hypoinflation. There is no focal parenchymal opacity, pleural   effusion, or pneumothorax. Normal cardiomediastinal silhouette. The   osseous structures are unremarkable.    IMPRESSION:     No acute disease of the chest.    Dictation Site: 3          Verified By: Jennette Banker, M.D., MD  Cardiology:    10-Apr-13 09:43, Echo Doppler   Echo Doppler    Interpretation Summary    Left ventricular systolic function is normal.ef 65% The left atrium   is moderately dilated. The right atrium is mild to moderately   dilated. There is mild to moderate mitral regurgitation. There is   mild tricuspid regurgitation. Mild aortic root dilatation.    Procedure:    A two-dimensional transthoracic echocardiogram with color flow and   Doppler was performed.    Left Ventricle    The left ventricle is normal in size.    Left ventricular systolic function is normal.    Right Ventricle    The right ventricle is normal in size and function.    Atria    The left atrium is moderately dilated.    The right atrium is mild to moderately  dilated.    Mitral Valve    The mitral valve leaflets appear thickened, but open well.    There is mild to moderate mitral regurgitation.    Tricuspid Valve    The tricuspid valve is normal in structure and function.    There is mild tricuspid regurgitation.    Aortic Valve    The aortic valve is normal in structure and function.    No aortic regurgitation is present.    Pulmonic Valve    The pulmonic valve is not well seen, but is grossly normal.    Great Vessels    Mild aortic root dilatation.    Pericardium/Pleural    No pericardial effusion.    MMode 2D Measurements and Calculations    RVDd: 4.1 cm    IVSd: 1.1 cm    LVIDd: 4.1 cm    LVIDs: 2.9 cm    LVPWd: 1.3 cm    FS: 28 %    EF(Teich): 55 %    Ao root diam: 3.8 cm    LA dimension: 4.5 cm    LVOT diam: 2.3 cm    Doppler Measurements and Calculations     MV E point:60 cm/sec    MV A point: 61 cm/sec    MV E/A: 0.98     MV dec time: 0.28 sec    Ao V2 max: 108 cm/sec    Ao max PG: 5.0 mmHg    AVA(V,D): 2.5 cm2    LV max PG: 2.0 mmHg    LV V1 max: 66 cm/sec    PA V2 max: 75 cm/sec    PA max PG: 2.0 mmHg    TR Max vel: 201 cm/sec    TR Max PG: 16 mmHg    RVSP: 21 mmHg    RAP systole: 5.0 mmHg    Reading Physician: Serafina Royals   Sonographer: Sherrie Sport  Interpreting Physician:  Serafina Royals,  electronically signed on   11-16-2011 17:13:29  Requesting Physician: Serafina Royals    No Known Allergies:   Vital Signs/Nurse's Notes: **Vital Signs.:   11-Apr-13 11:57   Vital Signs Type Routine   Temperature Temperature (F) 98.2   Celsius 36.7   Temperature Source oral   Pulse Pulse 72   Pulse source per Dinamap   Systolic BP Systolic BP 786   Diastolic BP (mmHg) Diastolic BP (mmHg) 70   Mean BP 91   BP Source Dinamap   Pulse Ox % Pulse Ox % 96   Pulse Ox Activity Level  At rest     Impression 1.Syncope with prolonged unconsciousness, hyperthermia with elevated troponin consistent with myocardial necrosis, but unsure if this represents Type I or TYpe 2, demand ischemia form prolonged unconsious/hyperthermia. Will plan on lexiscan myoview tomorrow to further differentiate. 2. Continue telemetry to watch afor arrthymias that may have contibuted to syncope. 3. Further evaluation past myoview results are known.  Pt seen in collaboration with Dr. Nehemiah Massed who agrees with above plan.   Electronic Signatures: Roderic Palau (NP)  (Signed 11-Apr-13 14:15)  Authored: General Aspect/Present Illness, History and Physical Exam, Review of System, Labs, Radiology, Allergies, Vital Signs/Nurse's Notes, Impression/Plan   Last Updated: 11-Apr-13 14:15 by Roderic Palau (NP)

## 2014-12-07 NOTE — Consult Note (Signed)
Brief Consult Note: Diagnosis: left buttock cellulitis, dementia.   Patient was seen by consultant.   Consult note dictated.   Recommend further assessment or treatment.   Comments: I see no indication for acute surgical intervention at present will re-assess in am.  Electronic Signatures: Sherri Rad (MD)  (Signed 14-Mar-16 14:02)  Authored: Brief Consult Note   Last Updated: 14-Mar-16 14:02 by Sherri Rad (MD)
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
## 2014-12-07 NOTE — Discharge Summary (Signed)
PATIENT NAME:  Kenneth Knight, Kenneth Knight MR#:  [id number] DATE OF BIRTH:  01/28/1932  DATE OF ADMISSION:  10/18/2014 DATE OF DISCHARGE:  10/21/2014  ADMITTING DIAGNOSIS: Cellulitis involving his left gluteal region.   DISCHARGE DIAGNOSES:  1. Left gluteal cellulitis with associated small abscess with spontaneous drainage.  2. Dementia.  3. Generalized weakness and deconditioning.   CONSULTANTS: Surgery, Dr. Marina Gravel.   PERTINENT AND EVALUATIONS: Admitting glucose 172, BUN 22, creatinine 1.01, sodium 137, potassium 4.3, chloride 104, CO2 of 25, calcium 9.1. LFTs were normal except alkaline phosphatase of 189, WBC on admission 12.2, hemoglobin 12.9, platelet count was 285,000. Blood cultures x2: No growth. Ultrasound of the left buttock shows findings consistent with cellulitis. No abscess identified.   HOSPITAL COURSE: Please refer to H and P done by the admitting physician. The patient is an 81 year old white male who, was brought to the hospital with pain in his left buttocks and erythema on his buttocks. The patient was brought to the ED and was noted to have cellulitis involving his left buttocks and was admitted and placed on antibiotics. The patient continued to have erythema, however that is slowly improved. He was seen by surgery. They did not feel that there was anything that needed to be drained. He is very deconditioned and he has generalized weakness. At this point, he is being arranged to discharge to a skilled nursing facility.   DISCHARGE MEDICATIONS: Benazepril 10 at bedtime, mirtazapine 30 at bedtime, amlodipine 10 daily, Namenda XR 1 tab p.o. daily, vitamin B12 1000 mcg daily, vitamin E 400 international units daily, vitamin D3, 2000 international units daily, vitamin C 1000 mg daily, acetaminophen 2 tabs every 4 hours p.r.n., Collagenase apply topically to the wound, clindamycin 300 mg 1 tab p.o. every 6 hours x5 days, amoxicillin/clavulanic 875/125 mg 1 tab p.o. every 12 hours x5 days.    DRESSING: Cleanse ulcer left ischium with normal saline and pat gently dry. Apply Santyl to the wound bed. Top with normal saline moist dressing secured with ABD pad, change daily.   DIET: Low sodium.   ACTIVITY: As tolerated.   FOLLOWUP: With the MD at the skilled nursing facility in 1 to 2 weeks.   TIME SPENT: 35 minutes.  ____________________________ Lafonda Mosses Posey Pronto, MD shp:ap D: 10/21/2014 11:03:31 ET T: 10/21/2014 11:34:01 ET JOB#: [id number]  cc: Harlow Carrizales H. Posey Pronto, MD, <Dictator> Alric Seton MD ELECTRONICALLY SIGNED 10/22/2014 14:22

## 2015-06-07 ENCOUNTER — Emergency Department: Payer: Medicare Other

## 2015-06-07 ENCOUNTER — Emergency Department
Admission: EM | Admit: 2015-06-07 | Discharge: 2015-06-07 | Disposition: A | Discharge status: Home / Self Care | Payer: Medicare Other | Attending: Emergency Medicine | Admitting: Emergency Medicine

## 2015-06-07 DIAGNOSIS — S7011XA Contusion of right thigh, initial encounter: Secondary | ICD-10-CM | POA: Insufficient documentation

## 2015-06-07 DIAGNOSIS — F0280 Dementia in other diseases classified elsewhere without behavioral disturbance: Secondary | ICD-10-CM | POA: Insufficient documentation

## 2015-06-07 DIAGNOSIS — Y9389 Activity, other specified: Secondary | ICD-10-CM | POA: Insufficient documentation

## 2015-06-07 DIAGNOSIS — Y92002 Bathroom of unspecified non-institutional (private) residence single-family (private) house as the place of occurrence of the external cause: Secondary | ICD-10-CM | POA: Diagnosis not present

## 2015-06-07 DIAGNOSIS — S7001XA Contusion of right hip, initial encounter: Secondary | ICD-10-CM | POA: Diagnosis not present

## 2015-06-07 DIAGNOSIS — G309 Alzheimer's disease, unspecified: Secondary | ICD-10-CM | POA: Diagnosis not present

## 2015-06-07 DIAGNOSIS — Y998 Other external cause status: Secondary | ICD-10-CM | POA: Diagnosis not present

## 2015-06-07 DIAGNOSIS — I10 Essential (primary) hypertension: Secondary | ICD-10-CM | POA: Diagnosis not present

## 2015-06-07 DIAGNOSIS — S79911A Unspecified injury of right hip, initial encounter: Secondary | ICD-10-CM | POA: Diagnosis present

## 2015-06-07 HISTORY — DX: Dementia in other diseases classified elsewhere, unspecified severity, without behavioral disturbance, psychotic disturbance, mood disturbance, and anxiety: F02.80

## 2015-06-07 HISTORY — DX: Essential (primary) hypertension: I10

## 2015-06-07 HISTORY — DX: Malignant (primary) neoplasm, unspecified: C80.1

## 2015-06-07 HISTORY — DX: Alzheimer's disease, unspecified: G30.9

## 2015-06-07 NOTE — ED Provider Notes (Signed)
[facility] Emergency Department Provider Note  ____________________________________________  Time seen: Approximately 7:20 PM  I have reviewed the triage vital signs and the nursing notes.   HISTORY  Chief Complaint Hip Pain and Fall    HPI Kenneth Knight is a 81 y.o. male who is essentially wheelchair bound and has a prior hip replacement of the right hip who presents by EMS after a mechanical fall with some pain in his right hip.  He tells me he was trying to transfer to his wheelchair when he fell.  He did not strike his head, lose consciousness, and he remembers the episode well.  He denies headache and neck pain.  He does have mild to moderate pain in his right hip that is made worse by movement.  However he is able to move all of his limbs at this time and he is actually preferring to lie on his right side in the exam bed.  Patient has a history of mild dementia but is currently alert and oriented to self and his location.  His family is present in the exam room.  He denies chest pain, shortness of breath, abdominal pain, nausea/vomiting.   Past Medical History  Diagnosis Date  . Alzheimer's dementia   . Hypertension   . Cancer ([place])     There are no active problems to display for this patient.   Past Surgical History  Procedure Laterality Date  . Hip surgery      right hip replacement    No current outpatient prescriptions on file.  Allergies Review of patient's allergies indicates no known allergies.  No family history on file.  Social History Social History  Substance Use Topics  . Smoking status: Never Smoker   . Smokeless tobacco: None  . Alcohol Use: No    Review of Systems Constitutional: No fever/chills Eyes: No visual changes. ENT: No sore throat. Cardiovascular: Denies chest pain. Respiratory: Denies shortness of breath. Gastrointestinal: No abdominal pain.  No nausea, no vomiting.  No diarrhea.  No  constipation. Genitourinary: Negative for dysuria. Musculoskeletal: Mild to moderate pain in his right hip after mechanical fall Skin: Negative for rash. Neurological: Negative for headaches, focal weakness or numbness, including in the affected extremity  10-point ROS otherwise negative.  ____________________________________________   PHYSICAL EXAM:  VITAL SIGNS: ED Triage Vitals  Enc Vitals Group     BP 06/07/15 1855 112/68 mmHg     Pulse Rate 06/07/15 1853 64     Resp 06/07/15 1853 16     Temp 06/07/15 1853 98.4 F (36.9 C)     Temp Source 06/07/15 1853 Oral     SpO2 06/07/15 1853 96 %     Weight 06/07/15 1853 185 lb (83.915 kg)     Height 06/07/15 1853 5\' 8"  (1.727 m)     Head Cir --      Peak Flow --      Pain Score 06/07/15 1854 8     Pain Loc --      Pain Edu? --      Excl. in [place]? --     Constitutional: Alert and oriented to self and location.  no acute distress. Eyes: Conjunctivae are normal. PERRL. EOMI. Head: Atraumatic. Nose: No congestion/rhinnorhea. Mouth/Throat: Mucous membranes are moist.  Oropharynx non-erythematous. Neck: No stridor.  No cervical spine tenderness to palpation. Cardiovascular: Normal rate, regular rhythm. Grossly normal heart sounds.  Good peripheral circulation. Respiratory: Normal respiratory effort.  No retractions. Lungs CTAB. Gastrointestinal:  Soft and nontender. No distention. No abdominal bruits. No CVA tenderness. Musculoskeletal: Moves all 4 extremities without difficulty.  No palpable deformity of the right lower extremity.  Pelvis is stable and nontender. Neurologic:  Normal speech and language. No gross focal neurologic deficits are appreciated.  Skin:  Skin is warm, dry and intact. No rash noted. Psychiatric: Mood and affect are normal. Speech and behavior are normal.  ____________________________________________   LABS (all labs ordered are listed, but only abnormal results are displayed)  Labs Reviewed - No data to  display ____________________________________________  EKG  Not indicated ____________________________________________  RADIOLOGY   Dg Hip Unilat  With Pelvis 2-3 Views Right  06/07/2015  CLINICAL DATA:  Right hip pain after moving from bed to wheelchair and falling today. Unable to straighten LEFT leg. EXAM: DG HIP (WITH OR WITHOUT PELVIS) 2-3V RIGHT COMPARISON:  RIGHT hip radiograph October 17, 2014 FINDINGS: Status post RIGHT hip total arthroplasty, intact well seated femoral and acetabular non set cemented components. Status post RIGHT acetabular ORIF with intact plate and screw fixation, no periprosthetic lucency. No dislocation. Extensive heterotopic ossification about the RIGHT hip, including stable large corticated superior lateral acetabular fragment. Severe degenerative change of the LEFT hip, with mild femoral head flattening. Pre acute therapy seeds project in lower pelvis. Patient is osteopenic without destructive bony lesions. Moderate vascular calcifications. IMPRESSION: Status post RIGHT hip total arthroplasty and RIGHT acetabular ORIF, with extensive heterotopic ossification. No acute fracture deformity or dislocation. Osteopenia, decreasing sensitivity for acute nondisplaced fractures. 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 Electronically Signed   By: Elon Alas M.D.   On: 06/07/2015 19:51    ____________________________________________   PROCEDURES  Procedure(s) performed: None  Critical Care performed: No ____________________________________________   INITIAL IMPRESSION / ASSESSMENT AND PLAN / ED COURSE  Pertinent labs & imaging results that were available during my care of the patient were reviewed by me and considered in my medical decision making (see chart for details).  The patient is in no acute distress and is in no pain at rest even though he is bearing weight on the affected hip.  His x-rays are unremarkable.  I  will let his family know the results and they are comfortable taking him back home.  He will follow-up with his regular doctor in the next several days.  ____________________________________________  FINAL CLINICAL IMPRESSION(S) / ED DIAGNOSES  Final diagnoses:  Contusion, hip and thigh, right, initial encounter      NEW MEDICATIONS STARTED DURING THIS VISIT:  New Prescriptions   No medications on file     Hinda Kehr, MD 06/07/15 2116

## 2015-06-07 NOTE — Discharge Instructions (Signed)
You have been seen in the Emergency Department (ED) today for a fall.  Your work up does not show any concerning injuries.  Please take over-the-counter ibuprofen and/or Tylenol as needed for your pain (unless you have an allergy or your doctor as told you not to take them), or take any prescribed medication as instructed.  Please follow up with your doctor regarding today's Emergency Department (ED) visit and your recent fall.    Return to the ED if you have any headache, confusion, slurred speech, weakness/numbness of any arm or leg, or any increased pain.   Contusion A contusion is a deep bruise. Contusions are the result of a blunt injury to tissues and muscle fibers under the skin. The injury causes bleeding under the skin. The skin overlying the contusion may turn blue, purple, or yellow. Minor injuries will give you a painless contusion, but more severe contusions may stay painful and swollen for a few weeks.  CAUSES  This condition is usually caused by a blow, trauma, or direct force to an area of the body. SYMPTOMS  Symptoms of this condition include:  Swelling of the injured area.  Pain and tenderness in the injured area.  Discoloration. The area may have redness and then turn blue, purple, or yellow. DIAGNOSIS  This condition is diagnosed based on a physical exam and medical history. An X-ray, CT scan, or MRI may be needed to determine if there are any associated injuries, such as broken bones (fractures). TREATMENT  Specific treatment for this condition depends on what area of the body was injured. In general, the best treatment for a contusion is resting, icing, applying pressure to (compression), and elevating the injured area. This is often called the RICE strategy. Over-the-counter anti-inflammatory medicines may also be recommended for pain control.  HOME CARE INSTRUCTIONS   Rest the injured area.  If directed, apply ice to the injured area:  Put ice in a plastic  bag.  Place a towel between your skin and the bag.  Leave the ice on for 20 minutes, 2-3 times per day.  If directed, apply light compression to the injured area using an elastic bandage. Make sure the bandage is not wrapped too tightly. Remove and reapply the bandage as directed by your health care provider.  If possible, raise (elevate) the injured area above the level of your heart while you are sitting or lying down.  Take over-the-counter and prescription medicines only as told by your health care provider. SEEK MEDICAL CARE IF:  Your symptoms do not improve after several days of treatment.  Your symptoms get worse.  You have difficulty moving the injured area. SEEK IMMEDIATE MEDICAL CARE IF:   You have severe pain.  You have numbness in a hand or foot.  Your hand or foot turns pale or cold.   This information is not intended to replace advice given to you by your health care provider. Make sure you discuss any questions you have with your health care provider.   Document Released: 05/04/2005 Document Revised: 04/15/2015 Document Reviewed: 12/10/2014 Elsevier Interactive Patient Education [employer].

## 2015-06-07 NOTE — ED Notes (Addendum)
Pt reports to ED via EMS w/ c/o fall and R hip pain.  Pt is currently wheelchair bound and was trying to use restroom when he fell.  Pt denies LOC, dizziness, or hitting head.  Pt sts he remebers fall.  Pt sts that he "just didn't make it back to my wheelchair".  Pt c/o R hip pain. Pt able to move all limbs.  Pt has had R hip replacement.  Pt has hx of dementia, but is alert and oriented to self/situation.

## 2015-12-03 ENCOUNTER — Ambulatory Visit: Payer: Medicare Other | Admitting: Podiatry

## 2015-12-09 ENCOUNTER — Ambulatory Visit: Payer: Medicare Other | Admitting: Podiatry

## 2015-12-09 ENCOUNTER — Encounter: Payer: Self-pay | Admitting: Podiatry

## 2015-12-09 DIAGNOSIS — F32A Depression, unspecified: Secondary | ICD-10-CM | POA: Insufficient documentation

## 2015-12-09 DIAGNOSIS — R2689 Other abnormalities of gait and mobility: Secondary | ICD-10-CM | POA: Insufficient documentation

## 2015-12-09 DIAGNOSIS — C61 Malignant neoplasm of prostate: Secondary | ICD-10-CM | POA: Insufficient documentation

## 2015-12-09 DIAGNOSIS — D649 Anemia, unspecified: Secondary | ICD-10-CM | POA: Insufficient documentation

## 2015-12-09 DIAGNOSIS — E785 Hyperlipidemia, unspecified: Secondary | ICD-10-CM | POA: Insufficient documentation

## 2015-12-09 DIAGNOSIS — L600 Ingrowing nail: Secondary | ICD-10-CM

## 2015-12-09 DIAGNOSIS — F0390 Unspecified dementia without behavioral disturbance: Secondary | ICD-10-CM | POA: Insufficient documentation

## 2015-12-09 DIAGNOSIS — F329 Major depressive disorder, single episode, unspecified: Secondary | ICD-10-CM | POA: Insufficient documentation

## 2015-12-09 DIAGNOSIS — M1990 Unspecified osteoarthritis, unspecified site: Secondary | ICD-10-CM | POA: Insufficient documentation

## 2015-12-09 DIAGNOSIS — T148XXA Other injury of unspecified body region, initial encounter: Secondary | ICD-10-CM | POA: Insufficient documentation

## 2015-12-09 NOTE — Progress Notes (Signed)
   Subjective:    Patient ID: TREAVOR UN, male    DOB: [birth year], 82 y.o.   MRN: [id number]  HPI: He presents today with a chief complaint of a bleeding toe hallux left is also complaining of elongated toenails. He presents today with his daughter from a an extended care facility for memory health. He denies trauma to the toe.    Review of Systems  All other systems reviewed and are negative.      Objective:   Physical Exam: Vital signs are stable alert and oriented 3 presents in a wheelchair nonambulatory. Pulses are strongly palpable bilateral. Contracted knee left. Mild hammertoe deformities are noted. Muscle strength not assessable deep tendon reflexes not assessable. Orthopedic evaluation rectus foot type mild hammertoe deformities. Cutaneous evaluationof a well-hydrated cutis thick yellow dystrophic onychomycotic nail and nearly avulsed hallux nail left.        Assessment & Plan:  Painful ingrown nail partial nail avulsion hallux left. Pain limbs secondary onychomycosis.  Plan: Discussed etiology pathology conservative versus surgical therapies. Performed a total nail avulsion today hallux left after local anesthesia was diminished tolerated the procedure well. He was provided with both oral and written home instructions for care and soaking of his toe. I also debrided his toenails 1 through 5 bilaterally covered service secondary to pain. Follow up with him in one week or so for reevaluation.

## 2015-12-09 NOTE — Patient Instructions (Signed)
tfBetadine Soak Instructions  Purchase an 8 oz. bottle of BETADINE solution (Povidone)  THE DAY AFTER THE PROCEDURE  Place 1 tablespoon of betadine solution in a quart of warm tap water.  Submerge your foot or feet with outer bandage intact for the initial soak; this will allow the bandage to become moist and wet for easy lift off.  Once you remove your bandage, continue to soak in the solution for 20 minutes.  This soak should be done twice a day.  Next, remove your foot or feet from solution, blot dry the affected area and cover.  You may use a band aid large enough to cover the area or use gauze and tape.  Apply other medications to the area as directed by the doctor such as cortisporin otic solution (ear drops) or neosporin.  IF YOUR SKIN BECOMES IRRITATED WHILE USING THESE INSTRUCTIONS, IT IS OKAY TO SWITCH TO EPSOM SALTS AND WATER OR WHITE VINEGAR AND WATER.

## 2015-12-21 ENCOUNTER — Ambulatory Visit (INDEPENDENT_AMBULATORY_CARE_PROVIDER_SITE_OTHER): Payer: Medicare Other | Admitting: Podiatry

## 2015-12-21 ENCOUNTER — Encounter: Payer: Self-pay | Admitting: Podiatry

## 2015-12-21 DIAGNOSIS — L600 Ingrowing nail: Secondary | ICD-10-CM

## 2015-12-22 NOTE — Progress Notes (Signed)
He presents today for follow-up of his nail avulsion hallux left. He states this seems to be doing pretty good.  Objective: Vital signs are stable he is alert and oriented 3. There is no erythema edema cellulitis drainage or odor. It appears to be completely healed at this point.  Assessment: Well-healing nail avulsion hallux left.  Plan: Follow up with me on an as-needed basis.

## 2015-12-23 ENCOUNTER — Telehealth: Payer: Self-pay | Admitting: *Deleted

## 2015-12-23 NOTE — Telephone Encounter (Signed)
Joycelyn Schmid called from [facility] requesting that we return her call, that she had a question.  I did return her call and receptionist states that she was not there currently and was unsure what she needed, but would leave her a message that I called her back.

## 2015-12-24 NOTE — Telephone Encounter (Signed)
Joycelyn Schmid wanted to know what ROV on pt's order sheet was.  I told her Return Office Visit.

## 2016-11-07 DIAGNOSIS — I10 Essential (primary) hypertension: Secondary | ICD-10-CM | POA: Insufficient documentation

## 2016-11-07 DIAGNOSIS — I5032 Chronic diastolic (congestive) heart failure: Secondary | ICD-10-CM | POA: Insufficient documentation

## 2016-12-28 ENCOUNTER — Emergency Department: Payer: Medicare Other

## 2016-12-28 ENCOUNTER — Emergency Department
Admission: EM | Admit: 2016-12-28 | Discharge: 2016-12-28 | Disposition: A | Discharge status: Home / Self Care | Payer: Medicare Other | Attending: Emergency Medicine | Admitting: Emergency Medicine

## 2016-12-28 DIAGNOSIS — Y929 Unspecified place or not applicable: Secondary | ICD-10-CM | POA: Diagnosis not present

## 2016-12-28 DIAGNOSIS — Z79899 Other long term (current) drug therapy: Secondary | ICD-10-CM | POA: Insufficient documentation

## 2016-12-28 DIAGNOSIS — S0990XA Unspecified injury of head, initial encounter: Secondary | ICD-10-CM | POA: Diagnosis present

## 2016-12-28 DIAGNOSIS — I10 Essential (primary) hypertension: Secondary | ICD-10-CM | POA: Insufficient documentation

## 2016-12-28 DIAGNOSIS — W06XXXA Fall from bed, initial encounter: Secondary | ICD-10-CM | POA: Insufficient documentation

## 2016-12-28 DIAGNOSIS — S0083XA Contusion of other part of head, initial encounter: Secondary | ICD-10-CM | POA: Insufficient documentation

## 2016-12-28 DIAGNOSIS — Y999 Unspecified external cause status: Secondary | ICD-10-CM | POA: Diagnosis not present

## 2016-12-28 DIAGNOSIS — Y939 Activity, unspecified: Secondary | ICD-10-CM | POA: Insufficient documentation

## 2016-12-28 DIAGNOSIS — T148XXA Other injury of unspecified body region, initial encounter: Secondary | ICD-10-CM

## 2016-12-28 DIAGNOSIS — W19XXXA Unspecified fall, initial encounter: Secondary | ICD-10-CM

## 2016-12-28 HISTORY — DX: Anemia, unspecified: D64.9

## 2016-12-28 HISTORY — DX: Hyperlipidemia, unspecified: E78.5

## 2016-12-28 NOTE — ED Notes (Signed)
Patients daughter called. [facility] told her we would call her when patient was seen. This was not relayed to [place]. Updated daughter that patient checked out fine and is on the way back to [facility].

## 2016-12-28 NOTE — ED Notes (Signed)
Pt returned from CT °

## 2016-12-28 NOTE — Discharge Instructions (Signed)
Please seek medical attention for any high fevers, chest pain, shortness of breath, change in behavior, persistent vomiting, bloody stool or any other new or concerning symptoms.  

## 2016-12-28 NOTE — ED Provider Notes (Signed)
[facility] Emergency Department Provider Note   ____________________________________________   I have reviewed the triage vital signs and the nursing notes.   HISTORY  Chief Complaint Fall   History limited by: Not Limited   HPI Kenneth Knight is a 83 y.o. male who presents to the emergency department today from living facility via EMS after falling out of bed. This happened just prior to transportation. Patient states he rolled out of bed and hit the back side. He denies any loss of consciousness. Denies any significant pain to his head. No neck pain. The patient did notice an associated contusion to the back of his head. He denies any recent illness.    Past Medical History:  Diagnosis Date  . Alzheimer's dementia   . Cancer ([place])   . Hypertension     Patient Active Problem List   Diagnosis Date Noted  . Absolute anemia 12/09/2015  . Arthritis 12/09/2015  . Imbalance 12/09/2015  . Dementia 12/09/2015  . Clinical depression 12/09/2015  . Injury of nerve 12/09/2015  . HLD (hyperlipidemia) 12/09/2015  . Malignant neoplasm of prostate ([place]) 12/09/2015  . Cellulitis of left lower extremity 07/12/2014    Past Surgical History:  Procedure Laterality Date  . HIP SURGERY     right hip replacement    Prior to Admission medications   Medication Sig Start Date End Date Taking? Authorizing Provider  acetaminophen (TYLENOL) 500 MG tablet Take 500 mg by mouth every 6 (six) hours as needed.    [provider]  amLODipine (NORVASC) 10 MG tablet  10/22/15   [provider]  Ascorbic Acid (VITAMIN C PO) Take by mouth.    [provider]  Calcium Polycarbophil (FIBER-LAX PO) Take by mouth.    [provider]  Cholecalciferol (VITAMIN D PO) Take by mouth.    [provider]  COCONUT OIL PO Take by mouth.    [provider]  Cyanocobalamin (VITAMIN B-12 PO) Take by mouth.    [provider]   donepezil (ARICEPT) 10 MG tablet  12/08/15   [provider]  memantine (NAMENDA) 10 MG tablet  12/01/15   [provider]  mirtazapine (REMERON) 30 MG tablet Take 30 mg by mouth at bedtime.    [provider]  VITAMIN E PO Take by mouth.    [provider]    Allergies Patient has no known allergies.  No family history on file.  Social History Social History  Substance Use Topics  . Smoking status: Never Smoker  . Smokeless tobacco: Not on file  . Alcohol use No    Review of Systems Constitutional: No fever/chills Eyes: No visual changes. ENT: No sore throat. Cardiovascular: Denies chest pain. Respiratory: Denies shortness of breath. Gastrointestinal: No abdominal pain.  No nausea, no vomiting.  No diarrhea.   Genitourinary: Negative for dysuria. Musculoskeletal: Negative for back pain. Skin: Contusion to back of head. Neurological: Negative for headaches, focal weakness or numbness.  ____________________________________________   PHYSICAL EXAM:  VITAL SIGNS: ED Triage Vitals [12/28/16 2211]  Enc Vitals Group     BP 156/104     Pulse 78     Resp 18     Temp 98.5     Temp src      SpO2 98     Weight 156 lb 15.5 oz (71.2 kg)     Height      Head Circumference      Peak Flow  Pain Score 0   Constitutional: Alert and alert. Well appearing and in no distress. Eyes: Conjunctivae are normal.  ENT   Head: Normocephalic. Roughly 2 cm diameter hematoma to back of head.    Nose: No congestion/rhinnorhea.   Mouth/Throat: Mucous membranes are moist.   Neck: No stridor. No midline tenderness. Hematological/Lymphatic/Immunilogical: No cervical lymphadenopathy. Cardiovascular: Normal rate, regular rhythm.  No murmurs, rubs, or gallops. Respiratory: Normal respiratory effort without tachypnea nor retractions. Breath sounds are clear and equal bilaterally. No wheezes/rales/rhonchi. Gastrointestinal: Soft and non tender. No  rebound. No guarding.  Genitourinary: Deferred Musculoskeletal: No hip tenderness with palpation and manipulation. Neurologic:  Normal speech and language. No gross focal neurologic deficits are appreciated.  Skin:  Skin is warm, dry and intact. No rash noted. Psychiatric: Mood and affect are normal. Speech and behavior are normal. Patient exhibits appropriate insight and judgment.  ____________________________________________    LABS (pertinent positives/negatives)  None  ____________________________________________   EKG  None  ____________________________________________    RADIOLOGY  CT head IMPRESSION:  Chronic microvascular ischemia and old left frontal lobe infarct  without acute intracranial abnormality.   ____________________________________________   PROCEDURES  Procedures  ____________________________________________   INITIAL IMPRESSION / ASSESSMENT AND PLAN / ED COURSE  Pertinent labs & imaging results that were available during my care of the patient were reviewed by me and considered in my medical decision making (see chart for details).  Patient presented to the emergency department today after falling from bed. At hematoma to the back of the head. CT head was negative. Will discharge back to living facility.  ____________________________________________   FINAL CLINICAL IMPRESSION(S) / ED DIAGNOSES  Final diagnoses:  Fall, initial encounter  Hematoma     Note: This dictation was prepared with Dragon dictation. Any transcriptional errors that result from this process are unintentional     Nance Pear, MD 12/28/16 [phone]

## 2016-12-28 NOTE — ED Notes (Addendum)
Pt bib EMS from [facility] w/ c/o fall. Per EMS, pt slipped out of bed and hit head. Pt denies pain at this time, head or neck, small nodule palpated back of head.  Pt alert, oriented to self and situation. Resp even and unlabored.  Pt denies CP, SOB, n/v/d or LOC.  NAD.  Per EMS, facility stated pt at baseline

## 2016-12-29 DIAGNOSIS — F0391 Unspecified dementia with behavioral disturbance: Secondary | ICD-10-CM | POA: Insufficient documentation

## 2016-12-29 DIAGNOSIS — W19XXXA Unspecified fall, initial encounter: Secondary | ICD-10-CM | POA: Insufficient documentation

## 2016-12-29 DIAGNOSIS — E785 Hyperlipidemia, unspecified: Secondary | ICD-10-CM | POA: Insufficient documentation

## 2016-12-29 DIAGNOSIS — M1990 Unspecified osteoarthritis, unspecified site: Secondary | ICD-10-CM | POA: Insufficient documentation

## 2016-12-29 DIAGNOSIS — I10 Essential (primary) hypertension: Secondary | ICD-10-CM | POA: Insufficient documentation

## 2016-12-29 DIAGNOSIS — D649 Anemia, unspecified: Secondary | ICD-10-CM | POA: Insufficient documentation

## 2016-12-29 DIAGNOSIS — F03918 Unspecified dementia, unspecified severity, with other behavioral disturbance: Secondary | ICD-10-CM | POA: Insufficient documentation

## 2017-03-10 DIAGNOSIS — Z0000 Encounter for general adult medical examination without abnormal findings: Secondary | ICD-10-CM | POA: Insufficient documentation

## 2017-07-16 ENCOUNTER — Emergency Department: Payer: Medicare Other

## 2017-07-16 ENCOUNTER — Encounter: Payer: Self-pay | Admitting: Emergency Medicine

## 2017-07-16 ENCOUNTER — Emergency Department
Admission: EM | Admit: 2017-07-16 | Discharge: 2017-07-16 | Disposition: A | Discharge status: Skilled Nursing Facility | Payer: Medicare Other | Attending: Emergency Medicine | Admitting: Emergency Medicine

## 2017-07-16 DIAGNOSIS — G309 Alzheimer's disease, unspecified: Secondary | ICD-10-CM | POA: Insufficient documentation

## 2017-07-16 DIAGNOSIS — Y92129 Unspecified place in nursing home as the place of occurrence of the external cause: Secondary | ICD-10-CM | POA: Diagnosis not present

## 2017-07-16 DIAGNOSIS — S0003XA Contusion of scalp, initial encounter: Secondary | ICD-10-CM | POA: Diagnosis not present

## 2017-07-16 DIAGNOSIS — I10 Essential (primary) hypertension: Secondary | ICD-10-CM | POA: Insufficient documentation

## 2017-07-16 DIAGNOSIS — Y939 Activity, unspecified: Secondary | ICD-10-CM | POA: Insufficient documentation

## 2017-07-16 DIAGNOSIS — Y998 Other external cause status: Secondary | ICD-10-CM | POA: Insufficient documentation

## 2017-07-16 DIAGNOSIS — W06XXXA Fall from bed, initial encounter: Secondary | ICD-10-CM | POA: Insufficient documentation

## 2017-07-16 DIAGNOSIS — Z8546 Personal history of malignant neoplasm of prostate: Secondary | ICD-10-CM | POA: Diagnosis not present

## 2017-07-16 DIAGNOSIS — Z79899 Other long term (current) drug therapy: Secondary | ICD-10-CM | POA: Insufficient documentation

## 2017-07-16 DIAGNOSIS — S0990XA Unspecified injury of head, initial encounter: Secondary | ICD-10-CM | POA: Diagnosis present

## 2017-07-16 DIAGNOSIS — W19XXXA Unspecified fall, initial encounter: Secondary | ICD-10-CM

## 2017-07-16 HISTORY — DX: Major depressive disorder, single episode, unspecified: F32.9

## 2017-07-16 HISTORY — DX: Unspecified fall, initial encounter: W19.XXXA

## 2017-07-16 HISTORY — DX: Repeated falls: R29.6

## 2017-07-16 MED ORDER — ACETAMINOPHEN 500 MG PO TABS
1000.0000 mg | ORAL_TABLET | Freq: Once | ORAL | Status: AC
  Administered 2017-07-16: 1000 mg via ORAL
  Filled 2017-07-16: qty 2

## 2017-07-16 NOTE — Discharge Instructions (Signed)

## 2017-07-16 NOTE — ED Notes (Signed)
Pt returned from CT. Family is at bedside.

## 2017-07-16 NOTE — ED Provider Notes (Signed)
[facility] Emergency Department Provider Note  ____________________________________________   First MD Initiated Contact with Patient 07/16/17 0021     (approximate)  I have reviewed the triage vital signs and the nursing notes.   HISTORY  Chief Complaint Fall  Level 5 caveat:  history/ROS limited by chronic dementia  HPI Kenneth Knight is a 83 y.o. male with extensive chronic medical history including dementia.  He was sent from [facility] by EMS after reportedly falling either from his wheelchair or from his bed.  Reportedly fall was witnessed and there was no LOC.  No bleeding, but "goose egg" on back of head.  Patient is awake and alert though disoriented to place and circumstances.  Denies pain except in the back of his head.  Denies pain in extremities.  Wheelchair bound at baseline with left leg disability.  No history of recent illness.   Past Medical History:  Diagnosis Date  . Alzheimer's dementia   . Anemia   . Cancer ([place])   . Falls   . Hyperlipidemia   . Hypertension   . Major depressive disorder     Patient Active Problem List   Diagnosis Date Noted  . Absolute anemia 12/09/2015  . Arthritis 12/09/2015  . Imbalance 12/09/2015  . Dementia 12/09/2015  . Clinical depression 12/09/2015  . Injury of nerve 12/09/2015  . HLD (hyperlipidemia) 12/09/2015  . Malignant neoplasm of prostate ([place]) 12/09/2015  . Cellulitis of left lower extremity 07/12/2014    Past Surgical History:  Procedure Laterality Date  . HIP SURGERY     right hip replacement    Prior to Admission medications   Medication Sig Start Date End Date Taking? Authorizing Provider  acetaminophen (TYLENOL) 500 MG tablet Take 500 mg by mouth every 6 (six) hours as needed.    [provider]  amLODipine (NORVASC) 10 MG tablet  10/22/15   [provider]  Ascorbic Acid (VITAMIN C PO) Take by mouth.    [provider]  Calcium Polycarbophil  (FIBER-LAX PO) Take by mouth.    [provider]  Cholecalciferol (VITAMIN D PO) Take by mouth.    [provider]  COCONUT OIL PO Take by mouth.    [provider]  Cyanocobalamin (VITAMIN B-12 PO) Take by mouth.    [provider]  donepezil (ARICEPT) 10 MG tablet  12/08/15   [provider]  memantine (NAMENDA) 10 MG tablet  12/01/15   [provider]  mirtazapine (REMERON) 30 MG tablet Take 30 mg by mouth at bedtime.    [provider]  VITAMIN E PO Take by mouth.    [provider]    Allergies Patient has no known allergies.  History reviewed. No pertinent family history.  Social History Social History   Tobacco Use  . Smoking status: Never Smoker  . Smokeless tobacco: Never Used  Substance Use Topics  . Alcohol use: No  . Drug use: No    Review of Systems Level 5 caveat:  history/ROS limited by chronic dementia ____________________________________________   PHYSICAL EXAM:  VITAL SIGNS: ED Triage Vitals  Enc Vitals Group     BP 07/16/17 0009 108/87     Pulse Rate 07/16/17 0009 (!) 59     Resp 07/16/17 0009 18     Temp 07/16/17 0009 98 F (36.7 C)     Temp Source 07/16/17 0009 Oral     SpO2 07/16/17 0009 96 %     Weight  07/16/17 0010 85.1 kg (187 lb 9.6 oz)     Height --      Head Circumference --      Peak Flow --      Pain Score --      Pain Loc --      Pain Edu? --      Excl. in [place]? --     Constitutional: Alert and oriented to self, responds appropriately to questions and is able to carry on a conversation.  No acute distress.   Eyes: Conjunctivae are normal.  Head: Large hematima to posterior scalp with no abrasion nor laceration.  Tender. Mouth/Throat: Mucous membranes are moist. Neck: No stridor.  No meningeal signs.   Cardiovascular: Normal rate, regular rhythm. Good peripheral circulation. Grossly normal heart sounds. Respiratory: Normal respiratory effort.  No retractions.  Lungs CTAB. Gastrointestinal: Soft and nontender. No distention.  Musculoskeletal: No lower extremity tenderness nor edema. No evidence of acute injury.  No pain/tenderness to palpation of upper extremities nor with passive ROM.  No pain/tenderness to palpation/ROM of RLE; patient denies pain in LLE, but did not specifically range it given chronic disability Neurologic:  Normal speech and language. No acute focal neurologic deficits are appreciated.  Skin:  Skin is warm, dry and intact. No rash noted.   ____________________________________________   LABS (all labs ordered are listed, but only abnormal results are displayed)  Labs Reviewed - No data to display ____________________________________________  EKG  None - EKG not ordered by ED physician ____________________________________________  RADIOLOGY   Ct Head Wo Contrast  Result Date: 07/16/2017 CLINICAL DATA:  83 year old male with fall. EXAM: CT HEAD WITHOUT CONTRAST CT CERVICAL SPINE WITHOUT CONTRAST TECHNIQUE: Multidetector CT imaging of the head and cervical spine was performed following the standard protocol without intravenous contrast. Multiplanar CT image reconstructions of the cervical spine were also generated. COMPARISON:  Head CT dated 12/08/2016 FINDINGS: Evaluation of this exam is limited due to motion artifact. CT HEAD FINDINGS Brain: There is age-related atrophy and chronic microvascular ischemic changes. 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 There is no acute intracranial hemorrhage. No mass effect or midline shift. No extra-axial fluid collection. Vascular: No hyperdense vessel or unexpected calcification. Skull: Normal. Negative for fracture or focal lesion. Sinuses/Orbits: No acute finding. Other: None CT CERVICAL SPINE FINDINGS Alignment: No acute subluxation. There is grade 1 C7-T1 anterolisthesis. There is reversal of normal cervical lordosis, likely related to degenerative changes. Skull base and vertebrae: No acute  fracture. Soft tissues and spinal canal: Mild narrowing of the central canal secondary to reversal of cervical lordosis. No acute canal hematoma. Disc levels: Multilevel degenerative disc disease with endplate irregularity and disc space narrowing and osteophyte. Upper chest: Negative. Other: There is atherosclerotic calcification of the aortic arch. IMPRESSION: 1. No acute intracranial hemorrhage. 2. Age-related atrophy chronic microvascular ischemic changes and focal old left parietal infarct. 3. No acute/traumatic cervical spine pathology. 4. Multilevel degenerative changes of the spine with reversal of normal cervical lordosis. Electronically Signed   By: Anner Crete M.D.   On: 07/16/2017 00:56   Ct Cervical Spine Wo Contrast  Result Date: 07/16/2017 CLINICAL DATA:  83 year old male with fall. EXAM: CT HEAD WITHOUT CONTRAST CT CERVICAL SPINE WITHOUT CONTRAST TECHNIQUE: Multidetector CT imaging of the head and cervical spine was performed following the standard protocol without intravenous contrast. Multiplanar CT image reconstructions of the cervical spine were also generated. COMPARISON:  Head CT dated 12/08/2016 FINDINGS: Evaluation of this exam is limited due to motion artifact. CT  HEAD FINDINGS Brain: There is age-related atrophy and chronic microvascular ischemic changes. 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 There is no acute intracranial hemorrhage. No mass effect or midline shift. No extra-axial fluid collection. Vascular: No hyperdense vessel or unexpected calcification. Skull: Normal. Negative for fracture or focal lesion. Sinuses/Orbits: No acute finding. Other: None CT CERVICAL SPINE FINDINGS Alignment: No acute subluxation. There is grade 1 C7-T1 anterolisthesis. There is reversal of normal cervical lordosis, likely related to degenerative changes. Skull base and vertebrae: No acute fracture. Soft tissues and spinal canal: Mild narrowing of the central canal secondary to reversal of cervical lordosis.  No acute canal hematoma. Disc levels: Multilevel degenerative disc disease with endplate irregularity and disc space narrowing and osteophyte. Upper chest: Negative. Other: There is atherosclerotic calcification of the aortic arch. IMPRESSION: 1. No acute intracranial hemorrhage. 2. Age-related atrophy chronic microvascular ischemic changes and focal old left parietal infarct. 3. No acute/traumatic cervical spine pathology. 4. Multilevel degenerative changes of the spine with reversal of normal cervical lordosis. Electronically Signed   By: Anner Crete M.D.   On: 07/16/2017 00:56    ____________________________________________   PROCEDURES  Critical Care performed: No   Procedure(s) performed:   Procedures   ____________________________________________   INITIAL IMPRESSION / ASSESSMENT AND PLAN / ED COURSE  As part of my medical decision making, I reviewed the following data within the [place] notes reviewed and incorporated    Differential diagnosis includes all the usual trauma possibilities, but the patient has no evidence of an acute injury other than the hematoma on the back of his head.  I obtain CT head and CT cervical spine given that he obviously had a traumatic injury to the back of his head even though had a low suspicion for emergent abnormality and fortunately the scans were reassuring.  My physical exam did not reveal any evidence of injuries to his extremities and I do not feel he would benefit from additional radiographs.  He appears to be at baseline and appropriate for outpatient follow-up.     ____________________________________________  FINAL CLINICAL IMPRESSION(S) / ED DIAGNOSES  Final diagnoses:  Fall, initial encounter  Contusion of scalp, initial encounter     MEDICATIONS GIVEN DURING THIS VISIT:  Medications  acetaminophen (TYLENOL) tablet 1,000 mg (1,000 mg Oral Given 07/16/17 0055)     ED Discharge Orders     None       Note:  This document was prepared using Dragon voice recognition software and may include unintentional dictation errors.    Hinda Kehr, MD 07/16/17 [phone]

## 2017-07-16 NOTE — ED Notes (Signed)
Patient transported to CT 

## 2017-07-16 NOTE — ED Triage Notes (Signed)
[facility] bringing pt from [facility] after a fall. Pt is w/c bound. Pt had fall from bed striking the back of his head per staff. -LOC. Pt has a hematoma to posterior portion of his head. Pt is not on blood thinners. Per staff pt is at his baseline prior to being picked up by EMS. Pt is awake and alert.

## 2017-07-16 NOTE — ED Notes (Signed)
Pt's family stating that NH stated that pt was having right shoulder pain after fall. Nurse performed ROM on pt and no complaints noted. Nurse informed Dr. Karma Greaser of family's concern.

## 2017-08-14 DIAGNOSIS — G301 Alzheimer's disease with late onset: Secondary | ICD-10-CM | POA: Diagnosis not present

## 2017-08-14 DIAGNOSIS — C61 Malignant neoplasm of prostate: Secondary | ICD-10-CM | POA: Diagnosis not present

## 2017-08-14 DIAGNOSIS — F0280 Dementia in other diseases classified elsewhere without behavioral disturbance: Secondary | ICD-10-CM | POA: Diagnosis not present

## 2017-08-14 DIAGNOSIS — F325 Major depressive disorder, single episode, in full remission: Secondary | ICD-10-CM | POA: Diagnosis not present

## 2017-08-14 DIAGNOSIS — I5032 Chronic diastolic (congestive) heart failure: Secondary | ICD-10-CM | POA: Diagnosis not present

## 2017-08-14 DIAGNOSIS — I10 Essential (primary) hypertension: Secondary | ICD-10-CM | POA: Diagnosis not present

## 2017-08-29 DIAGNOSIS — M2042 Other hammer toe(s) (acquired), left foot: Secondary | ICD-10-CM | POA: Diagnosis not present

## 2017-08-29 DIAGNOSIS — M2041 Other hammer toe(s) (acquired), right foot: Secondary | ICD-10-CM | POA: Diagnosis not present

## 2017-08-29 DIAGNOSIS — B351 Tinea unguium: Secondary | ICD-10-CM | POA: Diagnosis not present

## 2017-08-29 DIAGNOSIS — I739 Peripheral vascular disease, unspecified: Secondary | ICD-10-CM | POA: Diagnosis not present

## 2017-09-08 DIAGNOSIS — G4700 Insomnia, unspecified: Secondary | ICD-10-CM | POA: Diagnosis not present

## 2017-09-08 DIAGNOSIS — F321 Major depressive disorder, single episode, moderate: Secondary | ICD-10-CM | POA: Diagnosis not present

## 2017-09-08 DIAGNOSIS — F0280 Dementia in other diseases classified elsewhere without behavioral disturbance: Secondary | ICD-10-CM | POA: Diagnosis not present

## 2017-09-08 DIAGNOSIS — G309 Alzheimer's disease, unspecified: Secondary | ICD-10-CM | POA: Diagnosis not present

## 2017-09-11 DIAGNOSIS — F325 Major depressive disorder, single episode, in full remission: Secondary | ICD-10-CM | POA: Diagnosis not present

## 2017-09-11 DIAGNOSIS — F0280 Dementia in other diseases classified elsewhere without behavioral disturbance: Secondary | ICD-10-CM | POA: Diagnosis not present

## 2017-09-11 DIAGNOSIS — R2689 Other abnormalities of gait and mobility: Secondary | ICD-10-CM | POA: Diagnosis not present

## 2017-09-11 DIAGNOSIS — G301 Alzheimer's disease with late onset: Secondary | ICD-10-CM | POA: Diagnosis not present

## 2017-09-11 DIAGNOSIS — I5032 Chronic diastolic (congestive) heart failure: Secondary | ICD-10-CM | POA: Diagnosis not present

## 2017-09-11 DIAGNOSIS — I10 Essential (primary) hypertension: Secondary | ICD-10-CM | POA: Diagnosis not present

## 2017-11-23 DIAGNOSIS — I739 Peripheral vascular disease, unspecified: Secondary | ICD-10-CM | POA: Diagnosis not present

## 2017-11-23 DIAGNOSIS — B351 Tinea unguium: Secondary | ICD-10-CM | POA: Diagnosis not present

## 2017-11-30 DIAGNOSIS — L97812 Non-pressure chronic ulcer of other part of right lower leg with fat layer exposed: Secondary | ICD-10-CM | POA: Diagnosis not present

## 2017-11-30 DIAGNOSIS — S9032XD Contusion of left foot, subsequent encounter: Secondary | ICD-10-CM | POA: Diagnosis not present

## 2017-12-05 DIAGNOSIS — L97529 Non-pressure chronic ulcer of other part of left foot with unspecified severity: Secondary | ICD-10-CM | POA: Diagnosis not present

## 2017-12-05 DIAGNOSIS — L97813 Non-pressure chronic ulcer of other part of right lower leg with necrosis of muscle: Secondary | ICD-10-CM | POA: Diagnosis not present

## 2017-12-14 DIAGNOSIS — L97813 Non-pressure chronic ulcer of other part of right lower leg with necrosis of muscle: Secondary | ICD-10-CM | POA: Diagnosis not present

## 2017-12-14 DIAGNOSIS — L97529 Non-pressure chronic ulcer of other part of left foot with unspecified severity: Secondary | ICD-10-CM | POA: Diagnosis not present

## 2017-12-15 DIAGNOSIS — F0280 Dementia in other diseases classified elsewhere without behavioral disturbance: Secondary | ICD-10-CM | POA: Diagnosis not present

## 2017-12-15 DIAGNOSIS — G4700 Insomnia, unspecified: Secondary | ICD-10-CM | POA: Diagnosis not present

## 2017-12-15 DIAGNOSIS — G309 Alzheimer's disease, unspecified: Secondary | ICD-10-CM | POA: Diagnosis not present

## 2017-12-15 DIAGNOSIS — F321 Major depressive disorder, single episode, moderate: Secondary | ICD-10-CM | POA: Diagnosis not present

## 2017-12-20 DIAGNOSIS — Z79899 Other long term (current) drug therapy: Secondary | ICD-10-CM | POA: Diagnosis not present

## 2017-12-21 DIAGNOSIS — L97813 Non-pressure chronic ulcer of other part of right lower leg with necrosis of muscle: Secondary | ICD-10-CM | POA: Diagnosis not present

## 2017-12-21 DIAGNOSIS — L97529 Non-pressure chronic ulcer of other part of left foot with unspecified severity: Secondary | ICD-10-CM | POA: Diagnosis not present

## 2017-12-22 DIAGNOSIS — Z0000 Encounter for general adult medical examination without abnormal findings: Secondary | ICD-10-CM | POA: Diagnosis not present

## 2017-12-22 DIAGNOSIS — F325 Major depressive disorder, single episode, in full remission: Secondary | ICD-10-CM | POA: Diagnosis not present

## 2017-12-22 DIAGNOSIS — G301 Alzheimer's disease with late onset: Secondary | ICD-10-CM | POA: Diagnosis not present

## 2017-12-22 DIAGNOSIS — I5032 Chronic diastolic (congestive) heart failure: Secondary | ICD-10-CM | POA: Diagnosis not present

## 2017-12-22 DIAGNOSIS — F0280 Dementia in other diseases classified elsewhere without behavioral disturbance: Secondary | ICD-10-CM | POA: Diagnosis not present

## 2017-12-22 DIAGNOSIS — C61 Malignant neoplasm of prostate: Secondary | ICD-10-CM | POA: Diagnosis not present

## 2017-12-22 DIAGNOSIS — I10 Essential (primary) hypertension: Secondary | ICD-10-CM | POA: Diagnosis not present

## 2017-12-25 DIAGNOSIS — F321 Major depressive disorder, single episode, moderate: Secondary | ICD-10-CM | POA: Diagnosis not present

## 2017-12-25 DIAGNOSIS — G4700 Insomnia, unspecified: Secondary | ICD-10-CM | POA: Diagnosis not present

## 2017-12-25 DIAGNOSIS — G309 Alzheimer's disease, unspecified: Secondary | ICD-10-CM | POA: Diagnosis not present

## 2017-12-26 DIAGNOSIS — L97813 Non-pressure chronic ulcer of other part of right lower leg with necrosis of muscle: Secondary | ICD-10-CM | POA: Diagnosis not present

## 2017-12-27 DIAGNOSIS — I10 Essential (primary) hypertension: Secondary | ICD-10-CM | POA: Diagnosis not present

## 2017-12-27 DIAGNOSIS — E785 Hyperlipidemia, unspecified: Secondary | ICD-10-CM | POA: Diagnosis not present

## 2017-12-27 DIAGNOSIS — Z79899 Other long term (current) drug therapy: Secondary | ICD-10-CM | POA: Diagnosis not present

## 2018-01-04 DIAGNOSIS — L97813 Non-pressure chronic ulcer of other part of right lower leg with necrosis of muscle: Secondary | ICD-10-CM | POA: Diagnosis not present

## 2018-01-11 DIAGNOSIS — L97813 Non-pressure chronic ulcer of other part of right lower leg with necrosis of muscle: Secondary | ICD-10-CM | POA: Diagnosis not present

## 2018-01-18 DIAGNOSIS — L97813 Non-pressure chronic ulcer of other part of right lower leg with necrosis of muscle: Secondary | ICD-10-CM | POA: Diagnosis not present

## 2018-01-25 DIAGNOSIS — L97813 Non-pressure chronic ulcer of other part of right lower leg with necrosis of muscle: Secondary | ICD-10-CM | POA: Diagnosis not present

## 2018-01-30 ENCOUNTER — Encounter (INDEPENDENT_AMBULATORY_CARE_PROVIDER_SITE_OTHER): Payer: Self-pay

## 2018-01-30 ENCOUNTER — Ambulatory Visit (INDEPENDENT_AMBULATORY_CARE_PROVIDER_SITE_OTHER): Payer: Medicare HMO | Admitting: Vascular Surgery

## 2018-01-30 ENCOUNTER — Encounter (INDEPENDENT_AMBULATORY_CARE_PROVIDER_SITE_OTHER): Payer: Self-pay | Admitting: Vascular Surgery

## 2018-01-30 VITALS — BP 139/68 | HR 64 | Resp 15 | Ht 68.0 in | Wt 175.0 lb

## 2018-01-30 DIAGNOSIS — G309 Alzheimer's disease, unspecified: Secondary | ICD-10-CM

## 2018-01-30 DIAGNOSIS — I10 Essential (primary) hypertension: Secondary | ICD-10-CM | POA: Diagnosis not present

## 2018-01-30 DIAGNOSIS — F0280 Dementia in other diseases classified elsewhere without behavioral disturbance: Secondary | ICD-10-CM

## 2018-01-30 DIAGNOSIS — E785 Hyperlipidemia, unspecified: Secondary | ICD-10-CM | POA: Diagnosis not present

## 2018-01-30 DIAGNOSIS — I7025 Atherosclerosis of native arteries of other extremities with ulceration: Secondary | ICD-10-CM | POA: Insufficient documentation

## 2018-01-30 NOTE — Progress Notes (Signed)
Patient ID: Kenneth Knight, male   DOB: [birth year], 84 y.o.   MRN: [id number]  Chief Complaint  Patient presents with  . New Patient (Initial Visit)    PAD evaluation    HPI Kenneth Knight is a 84 y.o. male.  I am asked to see the patient by Dr. Romeo Rabon for evaluation of non-healing ulceration and likely PAD.  The patient reports weeks to months of a nonhealing ulceration on the right medial ankle area.  There was no clear trauma, injury, or inciting event that started the issue.  He also has several scabs on his feet and toes on the right.  No open ulcerations on the left.  He is nonambulatory.  It does not appear that these hurt him very well.  He is not a great historian.  He does have dementia.  His family provides much of the history.  He has been getting care at the wound care center who is doing excellent wound care, but it was concerning that they could not feel good pulses in his foot.   Past Medical History:  Diagnosis Date  . Alzheimer's dementia   . Anemia   . Cancer ([place])   . Falls   . Hyperlipidemia   . Hypertension   . Major depressive disorder     Past Surgical History:  Procedure Laterality Date  . HIP SURGERY     right hip replacement    Family History No reported history of bleeding disorders, clotting disorders, autoimmune diseases, or aneurysms.  Social History Social History   Tobacco Use  . Smoking status: Never Smoker  . Smokeless tobacco: Never Used  Substance Use Topics  . Alcohol use: No  . Drug use: No  Lives in a facility  No Known Allergies  Current Outpatient Medications  Medication Sig Dispense Refill  . acetaminophen (TYLENOL) 500 MG tablet Take 500 mg by mouth every 6 (six) hours as needed.    Marland Kitchen amLODipine (NORVASC) 10 MG tablet     . Ascorbic Acid (VITAMIN C PO) Take by mouth.    . Calcium Polycarbophil (FIBER-LAX PO) Take by mouth.    . Cholecalciferol (VITAMIN D PO) Take by mouth.    . COCONUT OIL PO Take by mouth.    .  Cyanocobalamin (VITAMIN B-12 PO) Take by mouth.    . donepezil (ARICEPT) 10 MG tablet     . memantine (NAMENDA) 10 MG tablet     . mirtazapine (REMERON) 15 MG tablet     . VITAMIN E PO Take by mouth.     No current facility-administered medications for this visit.       REVIEW OF SYSTEMS (Negative unless checked)  Constitutional: [] Weight loss  [] Fever  [] Chills Cardiac: [] Chest pain   [] Chest pressure   [] Palpitations   [] Shortness of breath when laying flat   [] Shortness of breath at rest   [] Shortness of breath with exertion. Vascular:  [] Pain in legs with walking   [] Pain in legs at rest   [] Pain in legs when laying flat   [] Claudication   [] Pain in feet when walking  [] Pain in feet at rest  [] Pain in feet when laying flat   [] History of DVT   [] Phlebitis   [x] Swelling in legs   [] Varicose veins   [x] Non-healing ulcers Pulmonary:   [] Uses home oxygen   [] Productive cough   [] Hemoptysis   [] Wheeze  [] COPD   [] Asthma Neurologic:  [] Dizziness  [] Blackouts   [] Seizures   [  x]History of stroke   [] History of TIA  [] Aphasia   [] Temporary blindness   [] Dysphagia   [] Weakness or numbness in arms   [] Weakness or numbness in legs Musculoskeletal:  [x] Arthritis   [] Joint swelling   [x] Joint pain   [] Low back pain Hematologic:  [] Easy bruising  [] Easy bleeding   [] Hypercoagulable state   [] Anemic  [] Hepatitis Gastrointestinal:  [] Blood in stool   [] Vomiting blood  [] Gastroesophageal reflux/heartburn   [] Abdominal pain Genitourinary:  [] Chronic kidney disease   [] Difficult urination  [] Frequent urination  [] Burning with urination   [] Hematuria Skin:  [] Rashes   [x] Ulcers   [x] Wounds Psychological:  [] History of anxiety   []  History of major depression.    Physical Exam BP 139/68 (BP Location: Left Arm)   Pulse 64   Resp 15   Ht 5\' 8"  (1.727 m)   Wt 175 lb (79.4 kg)   BMI 26.61 kg/m  Gen:  WD/WN, debilitated elderly male in a wheelchair Head: [place]/AT, No temporalis wasting.  Ear/Nose/Throat:  Hearing grossly intact, nares w/o erythema or drainage, oropharynx w/o Erythema/Exudate Eyes: Conjunctiva clear, sclera non-icteric  Neck: trachea midline.  No adenopathy Pulmonary:  Good air movement, respirations not labored, no use of accessory muscles Cardiac: Irregular, no JVD Vascular:  Vessel Right Left  Radial Palpable Palpable                          PT  not palpable  trace palpable  DP  1+ palpable  2+ palpable   Gastrointestinal: soft, non-tender/non-distended.  Musculoskeletal: Mild edema bilaterally.  Dressed ulceration just above the medial right ankle.  Several small scabs on the fourth and fifth toes as well as the medial foot on the right.  Both feet are cool with sluggish capillary refill.  Uses a wheelchair. Neurologic: Difficult to assess.  Moves all 4 extremities although seems to have rigidity and resistance in the left lower extremity. Psychiatric: Judgment and insight are poor.  Poor historian Dermatologic: Ulceration on the right foot and ankle as above  Radiology No results found.  Labs No results found for this or any previous visit (from the past 2160 hour(s)).  Assessment/Plan:  Dementia May have difficulty with compliance and understanding.  Hypertension blood pressure control important in reducing the progression of atherosclerotic disease. On appropriate oral medications.   HLD (hyperlipidemia) lipid control important in reducing the progression of atherosclerotic disease.   Atherosclerosis of native arteries of the extremities with ulceration ([place])  Recommend:  The patient has evidence of severe atherosclerotic changes of both lower extremities associated with ulceration and tissue loss of the foot.  This represents a limb threatening ischemia and places the patient at the risk for limb loss.  Patient should undergo angiography of the lower extremities with the hope for intervention for limb salvage.  The risks and benefits as well as  the alternative therapies was discussed in detail with the patient.  All questions were answered.  Patient agrees to proceed with angiography.  The patient will follow up with me in the office after the procedure.        Leotis Pain 01/30/2018, 4:39 PM   This note was created with Dragon medical transcription system.  Any errors from dictation are unintentional.

## 2018-01-30 NOTE — Assessment & Plan Note (Signed)
May have difficulty with compliance and understanding.

## 2018-01-30 NOTE — Assessment & Plan Note (Signed)
lipid control important in reducing the progression of atherosclerotic disease.   

## 2018-01-30 NOTE — Patient Instructions (Signed)

## 2018-01-30 NOTE — Assessment & Plan Note (Signed)
blood pressure control important in reducing the progression of atherosclerotic disease. On appropriate oral medications.  

## 2018-01-30 NOTE — Assessment & Plan Note (Signed)
Recommend:  The patient has evidence of severe atherosclerotic changes of both lower extremities associated with ulceration and tissue loss of the foot.  This represents a limb threatening ischemia and places the patient at the risk for limb loss.  Patient should undergo angiography of the lower extremities with the hope for intervention for limb salvage.  The risks and benefits as well as the alternative therapies was discussed in detail with the patient.  All questions were answered.  Patient agrees to proceed with angiography.  The patient will follow up with me in the office after the procedure.   

## 2018-01-31 ENCOUNTER — Other Ambulatory Visit (INDEPENDENT_AMBULATORY_CARE_PROVIDER_SITE_OTHER): Payer: Self-pay | Admitting: Vascular Surgery

## 2018-02-01 ENCOUNTER — Ambulatory Visit
Admission: RE | Admit: 2018-02-01 | Discharge: 2018-02-01 | Disposition: A | Discharge status: Skilled Nursing Facility | Payer: Medicare HMO | Source: Ambulatory Visit | Attending: Vascular Surgery | Admitting: Vascular Surgery

## 2018-02-01 ENCOUNTER — Encounter
Admission: RE | Disposition: A | Discharge status: Skilled Nursing Facility | Payer: Self-pay | Source: Ambulatory Visit | Attending: Vascular Surgery

## 2018-02-01 DIAGNOSIS — L97511 Non-pressure chronic ulcer of other part of right foot limited to breakdown of skin: Secondary | ICD-10-CM | POA: Diagnosis not present

## 2018-02-01 DIAGNOSIS — Z993 Dependence on wheelchair: Secondary | ICD-10-CM | POA: Insufficient documentation

## 2018-02-01 DIAGNOSIS — Z96641 Presence of right artificial hip joint: Secondary | ICD-10-CM | POA: Diagnosis not present

## 2018-02-01 DIAGNOSIS — Z859 Personal history of malignant neoplasm, unspecified: Secondary | ICD-10-CM | POA: Insufficient documentation

## 2018-02-01 DIAGNOSIS — I10 Essential (primary) hypertension: Secondary | ICD-10-CM | POA: Insufficient documentation

## 2018-02-01 DIAGNOSIS — Z79899 Other long term (current) drug therapy: Secondary | ICD-10-CM | POA: Insufficient documentation

## 2018-02-01 DIAGNOSIS — I7025 Atherosclerosis of native arteries of other extremities with ulceration: Secondary | ICD-10-CM | POA: Insufficient documentation

## 2018-02-01 DIAGNOSIS — G309 Alzheimer's disease, unspecified: Secondary | ICD-10-CM | POA: Diagnosis not present

## 2018-02-01 DIAGNOSIS — I70238 Atherosclerosis of native arteries of right leg with ulceration of other part of lower right leg: Secondary | ICD-10-CM

## 2018-02-01 DIAGNOSIS — I739 Peripheral vascular disease, unspecified: Secondary | ICD-10-CM

## 2018-02-01 DIAGNOSIS — E785 Hyperlipidemia, unspecified: Secondary | ICD-10-CM | POA: Insufficient documentation

## 2018-02-01 DIAGNOSIS — F0280 Dementia in other diseases classified elsewhere without behavioral disturbance: Secondary | ICD-10-CM | POA: Diagnosis not present

## 2018-02-01 DIAGNOSIS — L97812 Non-pressure chronic ulcer of other part of right lower leg with fat layer exposed: Secondary | ICD-10-CM | POA: Diagnosis not present

## 2018-02-01 HISTORY — PX: LOWER EXTREMITY ANGIOGRAPHY: CATH118251

## 2018-02-01 LAB — BUN: BUN: 19 mg/dL (ref 8–23)

## 2018-02-01 LAB — CREATININE, SERUM
CREATININE: 0.93 mg/dL (ref 0.61–1.24)
GFR calc Af Amer: >60 mL/min (ref >60)
GFR calc non Af Amer: >60 mL/min (ref >60)

## 2018-02-01 SURGERY — LOWER EXTREMITY ANGIOGRAPHY
Anesthesia: Moderate Sedation | Laterality: Right

## 2018-02-01 MED ORDER — ACETAMINOPHEN 325 MG PO TABS: 650.0000 mg | ORAL_TABLET | ORAL | Status: DC | PRN

## 2018-02-01 MED ORDER — HEPARIN SODIUM (PORCINE) 1000 UNIT/ML IJ SOLN
INTRAMUSCULAR | Status: DC | PRN
  Administered 2018-02-01: 5000 [IU] via INTRAVENOUS

## 2018-02-01 MED ORDER — CEFAZOLIN SODIUM-DEXTROSE 2-4 GM/100ML-% IV SOLN
INTRAVENOUS | Status: AC
  Filled 2018-02-01: qty 100

## 2018-02-01 MED ORDER — HEPARIN SODIUM (PORCINE) 1000 UNIT/ML IJ SOLN
INTRAMUSCULAR | Status: AC
  Filled 2018-02-01: qty 1

## 2018-02-01 MED ORDER — HEPARIN (PORCINE) IN NACL 1000-0.9 UT/500ML-% IV SOLN
INTRAVENOUS | Status: AC
  Filled 2018-02-01: qty 1000

## 2018-02-01 MED ORDER — ONDANSETRON HCL 4 MG/2ML IJ SOLN
4.0000 mg | Freq: Four times a day (QID) | INTRAMUSCULAR | Status: DC | PRN
Start: 2018-02-01 — End: 2018-02-01

## 2018-02-01 MED ORDER — FENTANYL CITRATE (PF) 100 MCG/2ML IJ SOLN
INTRAMUSCULAR | Status: AC
  Filled 2018-02-01: qty 2

## 2018-02-01 MED ORDER — ATORVASTATIN CALCIUM 10 MG PO TABS: 10.0000 mg | ORAL_TABLET | Freq: Every day | ORAL | 11 refills | Status: DC

## 2018-02-01 MED ORDER — LABETALOL HCL 5 MG/ML IV SOLN: 10.0000 mg | INTRAVENOUS | Status: DC | PRN

## 2018-02-01 MED ORDER — MIDAZOLAM HCL 2 MG/2ML IJ SOLN
INTRAMUSCULAR | Status: DC | PRN
  Administered 2018-02-01: 2 mg via INTRAVENOUS
  Administered 2018-02-01: 1 mg via INTRAVENOUS

## 2018-02-01 MED ORDER — FAMOTIDINE 20 MG PO TABS: 40.0000 mg | ORAL_TABLET | ORAL | Status: DC | PRN

## 2018-02-01 MED ORDER — FENTANYL CITRATE (PF) 100 MCG/2ML IJ SOLN
INTRAMUSCULAR | Status: DC | PRN
  Administered 2018-02-01: 50 ug via INTRAVENOUS

## 2018-02-01 MED ORDER — ONDANSETRON HCL 4 MG/2ML IJ SOLN: 4.0000 mg | Freq: Four times a day (QID) | INTRAMUSCULAR | Status: DC | PRN

## 2018-02-01 MED ORDER — MIDAZOLAM HCL 5 MG/5ML IJ SOLN
INTRAMUSCULAR | Status: AC
  Filled 2018-02-01: qty 5

## 2018-02-01 MED ORDER — CLOPIDOGREL BISULFATE 75 MG PO TABS: 75.0000 mg | ORAL_TABLET | Freq: Every day | ORAL | 11 refills | Status: DC

## 2018-02-01 MED ORDER — IOPAMIDOL (ISOVUE-300) INJECTION 61%
INTRAVENOUS | Status: DC | PRN
  Administered 2018-02-01: 55 mL via INTRA_ARTERIAL

## 2018-02-01 MED ORDER — METHYLPREDNISOLONE SODIUM SUCC 125 MG IJ SOLR: 125.0000 mg | INTRAMUSCULAR | Status: DC | PRN

## 2018-02-01 MED ORDER — ATORVASTATIN CALCIUM 10 MG PO TABS: 10.0000 mg | ORAL_TABLET | Freq: Every day | ORAL | Status: DC

## 2018-02-01 MED ORDER — SODIUM CHLORIDE 0.9% FLUSH
3.0000 mL | INTRAVENOUS | Status: DC | PRN
Start: 2018-02-01 — End: 2018-02-01

## 2018-02-01 MED ORDER — HYDRALAZINE HCL 20 MG/ML IJ SOLN: 5.0000 mg | INTRAMUSCULAR | Status: DC | PRN

## 2018-02-01 MED ORDER — HYDROMORPHONE HCL 1 MG/ML IJ SOLN: 1.0000 mg | Freq: Once | INTRAMUSCULAR | Status: DC | PRN

## 2018-02-01 MED ORDER — SODIUM CHLORIDE 0.9% FLUSH: 3.0000 mL | Freq: Two times a day (BID) | INTRAVENOUS | Status: DC

## 2018-02-01 MED ORDER — CEFAZOLIN SODIUM-DEXTROSE 2-4 GM/100ML-% IV SOLN
2.0000 g | Freq: Once | INTRAVENOUS | Status: AC
  Administered 2018-02-01: 2 g via INTRAVENOUS

## 2018-02-01 MED ORDER — SODIUM CHLORIDE 0.9 % IV SOLN
INTRAVENOUS | Status: DC
  Administered 2018-02-01: 09:00:00 via INTRAVENOUS

## 2018-02-01 MED ORDER — SODIUM CHLORIDE 0.9 % IV SOLN: INTRAVENOUS | Status: DC

## 2018-02-01 MED ORDER — CLOPIDOGREL BISULFATE 75 MG PO TABS: 75.0000 mg | ORAL_TABLET | Freq: Every day | ORAL | Status: DC

## 2018-02-01 MED ORDER — LIDOCAINE-EPINEPHRINE (PF) 1 %-1:200000 IJ SOLN
INTRAMUSCULAR | Status: AC
  Filled 2018-02-01: qty 30

## 2018-02-01 MED ORDER — SODIUM CHLORIDE 0.9 % IV SOLN: 250.0000 mL | INTRAVENOUS | Status: DC | PRN

## 2018-02-01 SURGICAL SUPPLY — 18 items
BALLN LUTONIX DCB 4X100X130 (BALLOONS) ×3
BALLN ULTRVRSE 4X80X150 (BALLOONS) ×3
BALLN ULTRVRSE 4X80X150 OTW (BALLOONS) ×1
BALLOON LUTONIX DCB 4X100X130 (BALLOONS) IMPLANT
BALLOON ULTRVRSE 4X80X150 OTW (BALLOONS) IMPLANT
CATH BEACON 5 .038 100 VERT TP (CATHETERS) ×2 IMPLANT
CATH PIG 70CM (CATHETERS) ×2 IMPLANT
DEVICE PRESTO INFLATION (MISCELLANEOUS) ×2 IMPLANT
DEVICE STARCLOSE SE CLOSURE (Vascular Products) ×2 IMPLANT
PACK ANGIOGRAPHY (CUSTOM PROCEDURE TRAY) ×3 IMPLANT
SHEATH BRITE TIP 5FRX11 (SHEATH) ×2 IMPLANT
SHEATH RAABE 6FRX70 (SHEATH) ×2 IMPLANT
STENT VIABAHN 5X50X120 (Permanent Stent) ×2 IMPLANT
SYR MEDRAD MARK V 150ML (SYRINGE) ×2 IMPLANT
TUBING CONTRAST HIGH PRESS 72 (TUBING) ×2 IMPLANT
WIRE G V18X300CM (WIRE) ×2 IMPLANT
WIRE J 3MM .035X145CM (WIRE) ×2 IMPLANT
WIRE MAGIC TORQUE 260C (WIRE) ×2 IMPLANT

## 2018-02-01 NOTE — Op Note (Signed)
[facility]  Percutaneous Study/Intervention Procedural Note   Date of Surgery: 02/01/2018  Surgeon(s):,    Assistants:none  Pre-operative Diagnosis: PAD with ulceration right lower extremity  Post-operative diagnosis:  Same  Procedure(s) Performed:             1.  Ultrasound guidance for vascular access left femoral artery             2.  Catheter placement into right peroneal artery from left femoral approach             3.  Aortogram and selective right lower extremity angiogram             4.  Percutaneous transluminal angioplasty of right peroneal artery and tibioperoneal trunk with 4 mm diameter by 8 cm length Lutonix drug-coated angioplasty balloon             5.   Viabahn stent placement to the right peroneal artery with 5 mm diameter by 5 cm length stent for greater than 50% residual stenosis after angioplasty  6.  StarClose closure device left femoral artery  EBL: 5 cc  Contrast: 55 cc  Fluoro Time: 4.2 minutes  Moderate Conscious Sedation Time: approximately 45 minutes using 3 mg of Versed and 50 Mcg of Fentanyl              Indications:  Patient is a 84 y.o.male with multiple nonhealing ulcerations and signs of significant ischemia of the right lower extremity. The patient is brought in for angiography for further evaluation and potential treatment.  Due to the limb threatening nature of the situation, angiogram was performed for attempted limb salvage. The patient is aware that if the procedure fails, amputation would be expected.  The patient also understands that even with successful revascularization, amputation may still be required due to the severity of the situation.  Risks and benefits are discussed and informed consent is obtained.   Procedure:  The patient was identified and appropriate procedural time out was performed.  The patient was then placed supine on the table and prepped and draped in the usual sterile fashion. Moderate  conscious sedation was administered during a face to face encounter with the patient throughout the procedure with my supervision of the RN administering medicines and monitoring the patient's vital signs, pulse oximetry, telemetry and mental status throughout from the start of the procedure until the patient was taken to the recovery room. Ultrasound was used to evaluate the left common femoral artery.  It was patent .  A digital ultrasound image was acquired.  A Seldinger needle was used to access the left common femoral artery under direct ultrasound guidance and a permanent image was performed.  A 0.035 J wire was advanced without resistance and a 5Fr sheath was placed.  Pigtail catheter was placed into the aorta and an AP aortogram was performed. This demonstrated normal renal arteries and normal aorta and iliac segments without significant stenosis.  The circulation time was extremely slow due to an obviously markedly reduced ejection fraction.  I then crossed the aortic bifurcation and advanced to the right femoral head. Selective right lower extremity angiogram was then performed. This demonstrated very large vessels with normal common femoral artery, profunda femoris artery, and superficial femoral artery with only mild stenosis of less than 30% in the mid and distal segments.  The popliteal artery was large.  There was then only one vessel runoff with a peroneal artery with chronic occlusion of the anterior  tibial and posterior tibial arteries.  The peroneal artery had a high-grade stenosis of greater than 85% in the first few centimeters of the vessel.  Again, circulation time was extremely slow and the catheter had to be advanced and placed in the mid to distal SFA to opacify the tibial vessels. The patient was systemically heparinized and a 6 French 70 cm sheath was then placed over the Magic torque wire. I then used a Kumpe catheter and the Magic torque wire to cross the peroneal artery stenosis and  confirm intraluminal flow in the mid peroneal artery below the lesion.  I then proceeded with treatment.  A 4 mm diameter by 8 cm length Lutonix drug-coated angioplasty balloon was then inflated to 8 atm for 1 minute and the proximal peroneal artery up into the tibioperoneal trunk.  A waist was taken which resolved with inflation.  Completion angiogram showed minimal improvement with greater than 70% residual stenosis after angioplasty in the first few centimeters of the peroneal artery.  I then exchanged for a 0.018 wire and deployed a 5 mm diameter by 5 cm length Viabahn stent in the proximal peroneal artery as this was his only runoff and he was clearly not a surgical candidate.  This was postdilated with a 4 mm balloon with excellent angiographic completion result and less than 10% residual stenosis. I elected to terminate the procedure. The sheath was removed and StarClose closure device was deployed in the left femoral artery with excellent hemostatic result. The patient was taken to the recovery room in stable condition having tolerated the procedure well.  Findings:               Aortogram:  This demonstrated normal renal arteries and normal aorta and iliac segments without significant stenosis.  The circulation time was extremely slow due to an obviously markedly reduced ejection fraction.              Right lower Extremity:  Very large vessels with normal common femoral artery, profunda femoris artery, and superficial femoral artery with only mild stenosis of less than 30% in the mid and distal segments.  The popliteal artery was large.  There was then only one vessel runoff with a peroneal artery with chronic occlusion of the anterior tibial and posterior tibial arteries.  The peroneal artery had a high-grade stenosis of greater than 85% in the first few centimeters of the vessel.  Again, circulation time was extremely slow and the catheter had to be advanced and placed in the mid to distal SFA to  opacify the tibial vessels.   Disposition: Patient was taken to the recovery room in stable condition having tolerated the procedure well.  Complications: None  [facility] 02/01/2018 10:40 AM   This note was created with Dragon Medical transcription system. Any errors in dictation are purely unintentional.

## 2018-02-01 NOTE — H&P (Signed)
[facility] History & Physical Update  The patient was interviewed and re-examined.  The patient's previous History and Physical has been reviewed and is unchanged.  There is no change in the plan of care. We plan to proceed with the scheduled procedure.  Leotis Pain, MD  02/01/2018, 9:00 AM

## 2018-02-09 DIAGNOSIS — F0280 Dementia in other diseases classified elsewhere without behavioral disturbance: Secondary | ICD-10-CM | POA: Diagnosis not present

## 2018-02-09 DIAGNOSIS — F321 Major depressive disorder, single episode, moderate: Secondary | ICD-10-CM | POA: Diagnosis not present

## 2018-02-09 DIAGNOSIS — G4700 Insomnia, unspecified: Secondary | ICD-10-CM | POA: Diagnosis not present

## 2018-02-09 DIAGNOSIS — L97812 Non-pressure chronic ulcer of other part of right lower leg with fat layer exposed: Secondary | ICD-10-CM | POA: Diagnosis not present

## 2018-02-09 DIAGNOSIS — G309 Alzheimer's disease, unspecified: Secondary | ICD-10-CM | POA: Diagnosis not present

## 2018-02-15 DIAGNOSIS — L97812 Non-pressure chronic ulcer of other part of right lower leg with fat layer exposed: Secondary | ICD-10-CM | POA: Diagnosis not present

## 2018-02-19 DIAGNOSIS — F0280 Dementia in other diseases classified elsewhere without behavioral disturbance: Secondary | ICD-10-CM | POA: Diagnosis not present

## 2018-02-19 DIAGNOSIS — G301 Alzheimer's disease with late onset: Secondary | ICD-10-CM | POA: Diagnosis not present

## 2018-02-19 DIAGNOSIS — F325 Major depressive disorder, single episode, in full remission: Secondary | ICD-10-CM | POA: Diagnosis not present

## 2018-02-19 DIAGNOSIS — R2689 Other abnormalities of gait and mobility: Secondary | ICD-10-CM | POA: Diagnosis not present

## 2018-02-22 DIAGNOSIS — L97812 Non-pressure chronic ulcer of other part of right lower leg with fat layer exposed: Secondary | ICD-10-CM | POA: Diagnosis not present

## 2018-03-01 DIAGNOSIS — L97812 Non-pressure chronic ulcer of other part of right lower leg with fat layer exposed: Secondary | ICD-10-CM | POA: Diagnosis not present

## 2018-03-08 DIAGNOSIS — L97812 Non-pressure chronic ulcer of other part of right lower leg with fat layer exposed: Secondary | ICD-10-CM | POA: Diagnosis not present

## 2018-03-09 DIAGNOSIS — L603 Nail dystrophy: Secondary | ICD-10-CM | POA: Insufficient documentation

## 2018-03-14 DIAGNOSIS — I739 Peripheral vascular disease, unspecified: Secondary | ICD-10-CM | POA: Diagnosis not present

## 2018-03-14 DIAGNOSIS — M2042 Other hammer toe(s) (acquired), left foot: Secondary | ICD-10-CM | POA: Diagnosis not present

## 2018-03-14 DIAGNOSIS — L84 Corns and callosities: Secondary | ICD-10-CM | POA: Diagnosis not present

## 2018-03-14 DIAGNOSIS — L603 Nail dystrophy: Secondary | ICD-10-CM | POA: Diagnosis not present

## 2018-03-14 DIAGNOSIS — M2041 Other hammer toe(s) (acquired), right foot: Secondary | ICD-10-CM | POA: Diagnosis not present

## 2018-03-14 DIAGNOSIS — B351 Tinea unguium: Secondary | ICD-10-CM | POA: Diagnosis not present

## 2018-03-16 ENCOUNTER — Other Ambulatory Visit (INDEPENDENT_AMBULATORY_CARE_PROVIDER_SITE_OTHER): Payer: Self-pay | Admitting: Vascular Surgery

## 2018-03-16 DIAGNOSIS — I739 Peripheral vascular disease, unspecified: Secondary | ICD-10-CM

## 2018-03-19 ENCOUNTER — Encounter (INDEPENDENT_AMBULATORY_CARE_PROVIDER_SITE_OTHER): Payer: Self-pay | Admitting: Vascular Surgery

## 2018-03-19 ENCOUNTER — Ambulatory Visit (INDEPENDENT_AMBULATORY_CARE_PROVIDER_SITE_OTHER): Payer: Medicare HMO

## 2018-03-19 ENCOUNTER — Ambulatory Visit (INDEPENDENT_AMBULATORY_CARE_PROVIDER_SITE_OTHER): Payer: Medicare HMO | Admitting: Vascular Surgery

## 2018-03-19 VITALS — BP 107/56 | HR 60 | Resp 16 | Ht 68.0 in | Wt 180.0 lb

## 2018-03-19 DIAGNOSIS — I7025 Atherosclerosis of native arteries of other extremities with ulceration: Secondary | ICD-10-CM

## 2018-03-19 DIAGNOSIS — L03116 Cellulitis of left lower limb: Secondary | ICD-10-CM

## 2018-03-19 DIAGNOSIS — E785 Hyperlipidemia, unspecified: Secondary | ICD-10-CM

## 2018-03-19 DIAGNOSIS — I739 Peripheral vascular disease, unspecified: Secondary | ICD-10-CM

## 2018-03-19 NOTE — Progress Notes (Signed)
Subjective:    Patient ID: [facility], male    DOB: 1932-06-10, 84 y.o.   MRN: [id number] Chief Complaint  Patient presents with  . Follow-up    [facility] 5wk abi   Patient presents for his first post procedure follow-up.  The patient is status post a right lower extremity angiogram with intervention on February 01, 2018.  The patient presents with family members.  Family members provide most of the history.  Seems that there is an improvement in the patient's right lower extremity since his recent intervention.  The patient does not present today with any ulcerations or pain to the bilateral lower extremity.  The patient is still struggling with controlling his edema.  Patient denies any recent bouts of cellulitis to the bilateral lower extremity.  The patient underwent a bilateral ABI which was notable for Right: 1.19 with biphasic tibial waveforms and Left: 1.33 with biphasic tibial waveforms.  Patient denies any fever, nausea vomiting.  Review of Systems  Constitutional: Negative.   HENT: Negative.   Eyes: Negative.   Respiratory: Negative.   Cardiovascular: Positive for leg swelling.  Gastrointestinal: Negative.   Endocrine: Negative.   Genitourinary: Negative.   Musculoskeletal: Negative.   Skin: Negative.   Allergic/Immunologic: Negative.   Neurological: Negative.   Hematological: Negative.   Psychiatric/Behavioral: Negative.       Objective:   Physical Exam  Constitutional: He is oriented to person, place, and time. He appears well-developed and well-nourished. No distress.  HENT:  Head: Normocephalic and atraumatic.  Right Ear: External ear normal.  Left Ear: External ear normal.  Eyes: Pupils are equal, round, and reactive to light. Conjunctivae and EOM are normal.  Neck: Normal range of motion.  Cardiovascular: Normal rate, regular rhythm, normal heart sounds and intact distal pulses.  Hard to palpate pedal pulses due to body habitus and edema  Pulmonary/Chest: Effort  normal and breath sounds normal.  Musculoskeletal: Normal range of motion. He exhibits edema (Mild to moderate nonpitting edema noted).  Neurological: He is alert and oriented to person, place, and time.  Skin: Skin is warm and dry. He is not diaphoretic.  Psychiatric: He has a normal mood and affect. His behavior is normal. Judgment and thought content normal.  Vitals reviewed.  BP (!) 107/56 (BP Location: Left Arm)   Pulse 60   Resp 16   Ht 5\' 8"  (1.727 m)   Wt 180 lb (81.6 kg)   BMI 27.37 kg/m   Past Medical History:  Diagnosis Date  . Alzheimer's dementia   . Anemia   . Cancer ([place])   . Falls   . Hyperlipidemia   . Hypertension   . Major depressive disorder     Social History   Socioeconomic History  . Marital status: Married    Spouse name: Not on file  . Number of children: Not on file  . Years of education: Not on file  . Highest education level: Not on file  Occupational History  . Not on file  Social Needs  . Financial resource strain: Not on file  . Food insecurity:    Worry: Not on file    Inability: Not on file  . Transportation needs:    Medical: Not on file    Non-medical: Not on file  Tobacco Use  . Smoking status: Never Smoker  . Smokeless tobacco: Never Used  Substance and Sexual Activity  . Alcohol use: No  . Drug use: No  . Sexual activity: Not  on file  Lifestyle  . Physical activity:    Days per week: Not on file    Minutes per session: Not on file  . Stress: Not on file  Relationships  . Social connections:    Talks on phone: Not on file    Gets together: Not on file    Attends religious service: Not on file    Active member of club or organization: Not on file    Attends meetings of clubs or organizations: Not on file    Relationship status: Not on file  . Intimate partner violence:    Fear of current or ex partner: Not on file    Emotionally abused: Not on file    Physically abused: Not on file    Forced sexual activity: Not  on file  Other Topics Concern  . Not on file  Social History Narrative  . Not on file    Past Surgical History:  Procedure Laterality Date  . HIP SURGERY     right hip replacement  . LOWER EXTREMITY ANGIOGRAPHY Right 02/01/2018   Procedure: LOWER EXTREMITY ANGIOGRAPHY;  Surgeon: Algernon Huxley, MD;  Location: [facility];  Service: Cardiovascular;  Laterality: Right;    Family History  Problem Relation Age of Onset  . Diabetes Sister   . Heart attack Brother   . Heart disease Brother   . Hypertension Brother     No Known Allergies     Assessment & Plan:  Patient presents for his first post procedure follow-up.  The patient is status post a right lower extremity angiogram with intervention on February 01, 2018.  The patient presents with family members.  Family members provide most of the history.  Seems that there is an improvement in the patient's right lower extremity since his recent intervention.  The patient does not present today with any ulcerations or pain to the bilateral lower extremity.  The patient is still struggling with controlling his edema.  Patient denies any recent bouts of cellulitis to the bilateral lower extremity.  The patient underwent a bilateral ABI which was notable for Right: 1.19 with biphasic tibial waveforms and Left: 1.33 with biphasic tibial waveforms.  Patient denies any fever, nausea vomiting.  1. Atherosclerosis of native arteries of the extremities with ulceration ([place]) - Stable Patient presents today for his first post procedure follow-up There are no ulcerations noted on today's exam Patient with normal ABIs bilaterally Patient denies any worsening claudication-like symptoms, rest pain or new ulcer development The patient is to follow-up in 6 months for an ABI and a right lower extremity arterial duplex I have discussed with the patient at length the risk factors for and pathogenesis of atherosclerotic disease and encouraged a healthy diet,  regular exercise regimen and blood pressure / glucose control.  The patient was encouraged to call the office in the interim if he experiences any claudication like symptoms, rest pain or ulcers to his feet / toes.  - VAS [place] ABI WITH/WO TBI; Future - VAS [place] LOWER EXTREMITY ARTERIAL DUPLEX; Future  2. Hyperlipidemia, unspecified hyperlipidemia type - Stable Encouraged good control as its slows the progression of atherosclerotic disease  3. Cellulitis of left lower extremity - Stable No recent bouts of cellulitis as per the patient's family The patient was encouraged to wear graduated compression stockings (20-30 mmHg) on a daily basis. The patient was instructed to begin wearing the stockings first thing in the morning and removing them in the evening. The  patient was instructed specifically not to sleep in the stockings. Prescription given.  In addition, behavioral modification including elevation during the day will be initiated. We discussed undergoing a bilateral lower extremity venous duplex to rule out any contributing venous versus lymphatic disease.  At this time, the family would like to try compression socks before moving forward with the ultrasound.   I will assess the patient's progress with conservative therapy when he follows up in 6 months for his peripheral artery disease Information on compression stockings was given to the patient. The patient was instructed to call the office in the interim if any worsening edema or ulcerations to the legs, feet or toes occurs. The patient expresses their understanding.  Current Outpatient Medications on File Prior to Visit  Medication Sig Dispense Refill  . acetaminophen (TYLENOL) 500 MG tablet Take 500 mg by mouth every 6 (six) hours as needed.    Marland Kitchen aspirin 81 MG chewable tablet Chew 81 mg by mouth daily.    Marland Kitchen atorvastatin (LIPITOR) 10 MG tablet Take 1 tablet (10 mg total) by mouth daily. 30 tablet 11  . Calcium Polycarbophil (FIBER-LAX  PO) Take by mouth.    . Cholecalciferol (VITAMIN D PO) Take by mouth.    . clopidogrel (PLAVIX) 75 MG tablet Take 1 tablet (75 mg total) by mouth daily. 30 tablet 11  . donepezil (ARICEPT) 10 MG tablet     . loratadine (CLARITIN) 10 MG tablet Take 10 mg by mouth daily.    . Melatonin 3 MG TABS Take by mouth at bedtime.    . memantine (NAMENDA) 10 MG tablet     . mirtazapine (REMERON) 15 MG tablet     . VITAMIN E PO Take by mouth.    Marland Kitchen amLODipine (NORVASC) 10 MG tablet     . Ascorbic Acid (VITAMIN C PO) Take by mouth.    . COCONUT OIL PO Take by mouth.    . Cyanocobalamin (VITAMIN B-12 PO) Take by mouth.     No current facility-administered medications on file prior to visit.    There are no Patient Instructions on file for this visit. No follow-ups on file.  KIMBERLY A STEGMAYER, PA-C

## 2018-04-27 DIAGNOSIS — I5032 Chronic diastolic (congestive) heart failure: Secondary | ICD-10-CM | POA: Diagnosis not present

## 2018-04-27 DIAGNOSIS — I10 Essential (primary) hypertension: Secondary | ICD-10-CM | POA: Diagnosis not present

## 2018-04-27 DIAGNOSIS — C61 Malignant neoplasm of prostate: Secondary | ICD-10-CM | POA: Diagnosis not present

## 2018-04-27 DIAGNOSIS — Z789 Other specified health status: Secondary | ICD-10-CM | POA: Diagnosis not present

## 2018-04-27 DIAGNOSIS — G301 Alzheimer's disease with late onset: Secondary | ICD-10-CM | POA: Diagnosis not present

## 2018-04-27 DIAGNOSIS — F325 Major depressive disorder, single episode, in full remission: Secondary | ICD-10-CM | POA: Diagnosis not present

## 2018-04-27 DIAGNOSIS — F0280 Dementia in other diseases classified elsewhere without behavioral disturbance: Secondary | ICD-10-CM | POA: Diagnosis not present

## 2018-05-09 DIAGNOSIS — F0280 Dementia in other diseases classified elsewhere without behavioral disturbance: Secondary | ICD-10-CM | POA: Diagnosis not present

## 2018-05-09 DIAGNOSIS — F321 Major depressive disorder, single episode, moderate: Secondary | ICD-10-CM | POA: Diagnosis not present

## 2018-05-09 DIAGNOSIS — G4700 Insomnia, unspecified: Secondary | ICD-10-CM | POA: Diagnosis not present

## 2018-05-09 DIAGNOSIS — G309 Alzheimer's disease, unspecified: Secondary | ICD-10-CM | POA: Diagnosis not present

## 2018-06-27 DIAGNOSIS — I5032 Chronic diastolic (congestive) heart failure: Secondary | ICD-10-CM | POA: Diagnosis not present

## 2018-06-27 DIAGNOSIS — F325 Major depressive disorder, single episode, in full remission: Secondary | ICD-10-CM | POA: Diagnosis not present

## 2018-06-27 DIAGNOSIS — I10 Essential (primary) hypertension: Secondary | ICD-10-CM | POA: Diagnosis not present

## 2018-06-27 DIAGNOSIS — Z789 Other specified health status: Secondary | ICD-10-CM | POA: Diagnosis not present

## 2018-06-27 DIAGNOSIS — G301 Alzheimer's disease with late onset: Secondary | ICD-10-CM | POA: Diagnosis not present

## 2018-06-27 DIAGNOSIS — F0280 Dementia in other diseases classified elsewhere without behavioral disturbance: Secondary | ICD-10-CM | POA: Diagnosis not present

## 2018-07-02 DIAGNOSIS — M2042 Other hammer toe(s) (acquired), left foot: Secondary | ICD-10-CM | POA: Diagnosis not present

## 2018-07-02 DIAGNOSIS — B351 Tinea unguium: Secondary | ICD-10-CM | POA: Diagnosis not present

## 2018-07-02 DIAGNOSIS — L603 Nail dystrophy: Secondary | ICD-10-CM | POA: Diagnosis not present

## 2018-07-02 DIAGNOSIS — I739 Peripheral vascular disease, unspecified: Secondary | ICD-10-CM | POA: Diagnosis not present

## 2018-07-02 DIAGNOSIS — M2041 Other hammer toe(s) (acquired), right foot: Secondary | ICD-10-CM | POA: Diagnosis not present

## 2018-07-11 DIAGNOSIS — H35033 Hypertensive retinopathy, bilateral: Secondary | ICD-10-CM | POA: Diagnosis not present

## 2018-07-11 DIAGNOSIS — H2513 Age-related nuclear cataract, bilateral: Secondary | ICD-10-CM | POA: Diagnosis not present

## 2018-07-13 DIAGNOSIS — F0280 Dementia in other diseases classified elsewhere without behavioral disturbance: Secondary | ICD-10-CM | POA: Diagnosis not present

## 2018-07-13 DIAGNOSIS — G309 Alzheimer's disease, unspecified: Secondary | ICD-10-CM | POA: Diagnosis not present

## 2018-07-13 DIAGNOSIS — F321 Major depressive disorder, single episode, moderate: Secondary | ICD-10-CM | POA: Diagnosis not present

## 2018-07-13 DIAGNOSIS — G4700 Insomnia, unspecified: Secondary | ICD-10-CM | POA: Diagnosis not present

## 2018-07-24 DIAGNOSIS — Z79899 Other long term (current) drug therapy: Secondary | ICD-10-CM | POA: Diagnosis not present

## 2018-09-11 DIAGNOSIS — Z0000 Encounter for general adult medical examination without abnormal findings: Secondary | ICD-10-CM | POA: Diagnosis not present

## 2018-09-11 DIAGNOSIS — F325 Major depressive disorder, single episode, in full remission: Secondary | ICD-10-CM | POA: Diagnosis not present

## 2018-09-11 DIAGNOSIS — F0280 Dementia in other diseases classified elsewhere without behavioral disturbance: Secondary | ICD-10-CM | POA: Diagnosis not present

## 2018-09-11 DIAGNOSIS — I5032 Chronic diastolic (congestive) heart failure: Secondary | ICD-10-CM | POA: Diagnosis not present

## 2018-09-11 DIAGNOSIS — C61 Malignant neoplasm of prostate: Secondary | ICD-10-CM | POA: Diagnosis not present

## 2018-09-11 DIAGNOSIS — G301 Alzheimer's disease with late onset: Secondary | ICD-10-CM | POA: Diagnosis not present

## 2018-09-11 DIAGNOSIS — Z789 Other specified health status: Secondary | ICD-10-CM | POA: Diagnosis not present

## 2018-09-11 DIAGNOSIS — I10 Essential (primary) hypertension: Secondary | ICD-10-CM | POA: Diagnosis not present

## 2018-09-14 DIAGNOSIS — G309 Alzheimer's disease, unspecified: Secondary | ICD-10-CM | POA: Diagnosis not present

## 2018-09-14 DIAGNOSIS — F0280 Dementia in other diseases classified elsewhere without behavioral disturbance: Secondary | ICD-10-CM | POA: Diagnosis not present

## 2018-09-14 DIAGNOSIS — F321 Major depressive disorder, single episode, moderate: Secondary | ICD-10-CM | POA: Diagnosis not present

## 2018-09-14 DIAGNOSIS — G4700 Insomnia, unspecified: Secondary | ICD-10-CM | POA: Diagnosis not present

## 2018-09-20 DIAGNOSIS — B351 Tinea unguium: Secondary | ICD-10-CM | POA: Diagnosis not present

## 2018-09-20 DIAGNOSIS — I739 Peripheral vascular disease, unspecified: Secondary | ICD-10-CM | POA: Insufficient documentation

## 2018-09-20 DIAGNOSIS — R600 Localized edema: Secondary | ICD-10-CM | POA: Diagnosis not present

## 2018-10-02 DIAGNOSIS — I10 Essential (primary) hypertension: Secondary | ICD-10-CM | POA: Diagnosis not present

## 2018-10-02 DIAGNOSIS — D649 Anemia, unspecified: Secondary | ICD-10-CM | POA: Diagnosis not present

## 2018-10-05 ENCOUNTER — Ambulatory Visit (INDEPENDENT_AMBULATORY_CARE_PROVIDER_SITE_OTHER): Payer: Medicare HMO | Admitting: Nurse Practitioner

## 2018-10-05 ENCOUNTER — Encounter (INDEPENDENT_AMBULATORY_CARE_PROVIDER_SITE_OTHER): Payer: Medicare HMO

## 2018-10-29 ENCOUNTER — Encounter (INDEPENDENT_AMBULATORY_CARE_PROVIDER_SITE_OTHER): Payer: Medicare HMO

## 2018-10-29 ENCOUNTER — Ambulatory Visit (INDEPENDENT_AMBULATORY_CARE_PROVIDER_SITE_OTHER): Payer: Medicare HMO | Admitting: Nurse Practitioner

## 2018-11-02 ENCOUNTER — Emergency Department: Payer: Medicare HMO

## 2018-11-02 ENCOUNTER — Other Ambulatory Visit: Payer: Self-pay

## 2018-11-02 ENCOUNTER — Emergency Department
Admission: EM | Admit: 2018-11-02 | Discharge: 2018-11-02 | Disposition: A | Discharge status: Home / Self Care | Payer: Medicare HMO | Attending: Emergency Medicine | Admitting: Emergency Medicine

## 2018-11-02 ENCOUNTER — Encounter: Payer: Self-pay | Admitting: Intensive Care

## 2018-11-02 DIAGNOSIS — G309 Alzheimer's disease, unspecified: Secondary | ICD-10-CM | POA: Insufficient documentation

## 2018-11-02 DIAGNOSIS — R52 Pain, unspecified: Secondary | ICD-10-CM | POA: Diagnosis not present

## 2018-11-02 DIAGNOSIS — Z7982 Long term (current) use of aspirin: Secondary | ICD-10-CM | POA: Diagnosis not present

## 2018-11-02 DIAGNOSIS — Z79899 Other long term (current) drug therapy: Secondary | ICD-10-CM | POA: Diagnosis not present

## 2018-11-02 DIAGNOSIS — I10 Essential (primary) hypertension: Secondary | ICD-10-CM | POA: Insufficient documentation

## 2018-11-02 DIAGNOSIS — S7001XA Contusion of right hip, initial encounter: Secondary | ICD-10-CM | POA: Diagnosis not present

## 2018-11-02 DIAGNOSIS — W050XXA Fall from non-moving wheelchair, initial encounter: Secondary | ICD-10-CM | POA: Diagnosis not present

## 2018-11-02 DIAGNOSIS — W010XXA Fall on same level from slipping, tripping and stumbling without subsequent striking against object, initial encounter: Secondary | ICD-10-CM | POA: Diagnosis not present

## 2018-11-02 DIAGNOSIS — Z7902 Long term (current) use of antithrombotics/antiplatelets: Secondary | ICD-10-CM | POA: Diagnosis not present

## 2018-11-02 DIAGNOSIS — R58 Hemorrhage, not elsewhere classified: Secondary | ICD-10-CM | POA: Diagnosis not present

## 2018-11-02 DIAGNOSIS — S8011XA Contusion of right lower leg, initial encounter: Secondary | ICD-10-CM

## 2018-11-02 DIAGNOSIS — R404 Transient alteration of awareness: Secondary | ICD-10-CM | POA: Diagnosis not present

## 2018-11-02 DIAGNOSIS — I2510 Atherosclerotic heart disease of native coronary artery without angina pectoris: Secondary | ICD-10-CM | POA: Diagnosis not present

## 2018-11-02 DIAGNOSIS — W19XXXA Unspecified fall, initial encounter: Secondary | ICD-10-CM

## 2018-11-02 DIAGNOSIS — Y939 Activity, unspecified: Secondary | ICD-10-CM | POA: Diagnosis not present

## 2018-11-02 DIAGNOSIS — Y999 Unspecified external cause status: Secondary | ICD-10-CM | POA: Insufficient documentation

## 2018-11-02 DIAGNOSIS — F0280 Dementia in other diseases classified elsewhere without behavioral disturbance: Secondary | ICD-10-CM | POA: Diagnosis not present

## 2018-11-02 DIAGNOSIS — R609 Edema, unspecified: Secondary | ICD-10-CM | POA: Diagnosis not present

## 2018-11-02 DIAGNOSIS — I959 Hypotension, unspecified: Secondary | ICD-10-CM | POA: Diagnosis not present

## 2018-11-02 DIAGNOSIS — S0990XA Unspecified injury of head, initial encounter: Secondary | ICD-10-CM | POA: Diagnosis not present

## 2018-11-02 DIAGNOSIS — S79911A Unspecified injury of right hip, initial encounter: Secondary | ICD-10-CM | POA: Diagnosis present

## 2018-11-02 DIAGNOSIS — Y92129 Unspecified place in nursing home as the place of occurrence of the external cause: Secondary | ICD-10-CM | POA: Insufficient documentation

## 2018-11-02 DIAGNOSIS — R9431 Abnormal electrocardiogram [ECG] [EKG]: Secondary | ICD-10-CM | POA: Diagnosis not present

## 2018-11-02 LAB — CBC
HEMATOCRIT: 33.5 % — AB (ref 39.0–52.0)
Hemoglobin: 10.8 g/dL — ABNORMAL LOW (ref 13.0–17.0)
MCH: 31.8 pg (ref 26.0–34.0)
MCHC: 32.2 g/dL (ref 30.0–36.0)
MCV: 98.5 fL (ref 80.0–100.0)
NRBC: 0 % (ref 0.0–0.2)
Platelets: 284 10*3/uL (ref 150–400)
RBC: 3.4 MIL/uL — ABNORMAL LOW (ref 4.22–5.81)
RDW: 15.6 % — AB (ref 11.5–15.5)
WBC: 10.2 10*3/uL (ref 4.0–10.5)

## 2018-11-02 LAB — BASIC METABOLIC PANEL
ANION GAP: 8 (ref 5–15)
BUN: 20 mg/dL (ref 8–23)
CALCIUM: 8.6 mg/dL — AB (ref 8.9–10.3)
CO2: 27 mmol/L (ref 22–32)
Chloride: 107 mmol/L (ref 98–111)
Creatinine, Ser: 0.66 mg/dL (ref 0.61–1.24)
GFR calc Af Amer: >60 mL/min (ref >60)
GFR calc non Af Amer: >60 mL/min (ref >60)
GLUCOSE: 102 mg/dL — AB (ref 70–99)
POTASSIUM: 4 mmol/L (ref 3.5–5.1)
Sodium: 142 mmol/L (ref 135–145)

## 2018-11-02 LAB — TYPE AND SCREEN
ABO/RH(D): B POS
Antibody Screen: NEGATIVE

## 2018-11-02 LAB — PROTIME-INR
INR: 1.1 (ref 0.8–1.2)
Prothrombin Time: 14.3 seconds (ref 11.4–15.2)

## 2018-11-02 LAB — APTT: APTT: 29 s (ref 24–36)

## 2018-11-02 NOTE — ED Provider Notes (Signed)
[facility] Emergency Department Provider Note  ____________________________________________  Time seen: Approximately 10:36 AM  I have reviewed the triage vital signs and the nursing notes.   HISTORY  Chief Complaint Fall   HPI Kenneth Knight is a 85 y.o. male with a history of dementia, PAD on Plavix, hypertension, hyperlipidemia, anemia who presents for evaluation of fall.  According to the nursing home patient had a fall 2 days ago.  He was in his room and was trying to transition from his wheelchair to the recliner.  He fell on the ground.  Although the fall was not witnessed the staff heard the noise and responded immediately.  Patient was sitting between the recliner in the wheelchair.  They do not believe patient hit his head.  Today a nurse practitioner saw the patient had a large hematoma of the right hip and right lower extremity which prompted visit to the emergency room.  Patient has advanced dementia, he is currently at baseline, unable to provide any history.  He does complain of pain when I palpate or move his right hip.  Past Medical History:  Diagnosis Date   Alzheimer's dementia ([place])    Anemia    Cancer ([place])    Falls    Hyperlipidemia    Hypertension    Major depressive disorder     Patient Active Problem List   Diagnosis Date Noted   Hypertension 01/30/2018   Atherosclerosis of native arteries of the extremities with ulceration ([place]) 01/30/2018   Absolute anemia 12/09/2015   Arthritis 12/09/2015   Imbalance 12/09/2015   Dementia ([place]) 12/09/2015   Clinical depression 12/09/2015   Injury of nerve 12/09/2015   HLD (hyperlipidemia) 12/09/2015   Malignant neoplasm of prostate ([place]) 12/09/2015   Cellulitis of left lower extremity 07/12/2014    Past Surgical History:  Procedure Laterality Date   HIP SURGERY     right hip replacement   LOWER EXTREMITY ANGIOGRAPHY Right 02/01/2018   Procedure: LOWER EXTREMITY  ANGIOGRAPHY;  Surgeon: Algernon Huxley, MD;  Location: [facility];  Service: Cardiovascular;  Laterality: Right;    Prior to Admission medications   Medication Sig Start Date End Date Taking? Authorizing Provider  acetaminophen (TYLENOL) 500 MG tablet Take 500 mg by mouth every 6 (six) hours as needed.    [provider]  amLODipine (NORVASC) 10 MG tablet  10/22/15   [provider]  Ascorbic Acid (VITAMIN C PO) Take by mouth.    [provider]  aspirin 81 MG chewable tablet Chew 81 mg by mouth daily.    [provider]  atorvastatin (LIPITOR) 10 MG tablet Take 1 tablet (10 mg total) by mouth daily. 02/01/18 02/01/19  Algernon Huxley, MD  Calcium Polycarbophil (FIBER-LAX PO) Take by mouth.    [provider]  Cholecalciferol (VITAMIN D PO) Take by mouth.    [provider]  clopidogrel (PLAVIX) 75 MG tablet Take 1 tablet (75 mg total) by mouth daily. 02/01/18   Algernon Huxley, MD  COCONUT OIL PO Take by mouth.    [provider]  Cyanocobalamin (VITAMIN B-12 PO) Take by mouth.    [provider]  donepezil (ARICEPT) 10 MG tablet  12/08/15   [provider]  loratadine (CLARITIN) 10 MG tablet Take 10 mg by mouth daily.    [provider]  Melatonin 3 MG TABS Take by mouth at bedtime.    [provider]  memantine (NAMENDA) 10 MG  tablet  12/01/15   [provider]  mirtazapine (REMERON) 15 MG tablet  01/17/18   [provider]  VITAMIN E PO Take by mouth.    [provider]    Allergies Patient has no known allergies.  Family History  Problem Relation Age of Onset   Diabetes Sister    Heart attack Brother    Heart disease Brother    Hypertension Brother     Social History Social History   Tobacco Use   Smoking status: Never Smoker   Smokeless tobacco: Never Used  Substance Use Topics   Alcohol use: No   Drug use: No    Review of  Systems Constitutional: Negative for fever. ENT: Negative for facial injury or neck injury Cardiovascular: Negative for chest injury. Respiratory: Negative for shortness of breath. Negative for chest wall injury. Musculoskeletal: Negative for back injury, + R hip and RLE pain Skin: Negative for laceration/abrasions.   ____________________________________________   PHYSICAL EXAM:  VITAL SIGNS: ED Triage Vitals  Enc Vitals Group     BP 11/02/18 1028 131/62     Pulse Rate 11/02/18 1028 72     Resp 11/02/18 1028 16     Temp 11/02/18 1028 97.6 F (36.4 C)     Temp Source 11/02/18 1028 Oral     SpO2 11/02/18 1028 100 %     Weight 11/02/18 1029 180 lb (81.6 kg)     Height --      Head Circumference --      Peak Flow --      Pain Score --      Pain Loc --      Pain Edu? --      Excl. in [place]? --    Full spinal precautions maintained throughout the trauma exam. Constitutional: Alert, disoriented. No acute distress. Does not appear intoxicated. HEENT Head: Normocephalic and atraumatic. Face: No facial bony tenderness. Stable midface Ears: No hemotympanum bilaterally. No Battle sign Eyes: No eye injury. PERRL. No raccoon eyes Nose: Nontender. No epistaxis. No rhinorrhea Mouth/Throat: Mucous membranes are moist. No oropharyngeal blood. No dental injury. Airway patent without stridor. Normal voice. Neck: no C-collar in place. No midline c-spine tenderness.  Cardiovascular: Normal rate, regular rhythm. Normal and symmetric distal pulses are present in all extremities. Pulmonary/Chest: Chest wall is stable and nontender to palpation/compression. Normal respiratory effort. Breath sounds are normal. No crepitus.  Abdominal: Soft, nontender, non distended. Musculoskeletal: Patient has a large hematoma in the RLE staring on the hip and moving down to his knee. Patient is tender with ROM of the hip or palpation of the hip.  No thoracic or lumbar midline spinal tenderness. Pelvis is  stable. Skin: Skin is warm, dry and intact. No abrasions or contutions. Psychiatric: Speech and behavior are appropriate. Neurological: Normal speech and language. Moves all extremities to command. No gross focal neurologic deficits are appreciated.  Glascow Coma Score: 4 - Opens eyes on own 6 - Follows simple motor commands 4 - Seems confused, disoriented GCS: 14   ____________________________________________   LABS (all labs ordered are listed, but only abnormal results are displayed)  Labs Reviewed  CBC - Abnormal; Notable for the following components:      Result Value   RBC 3.40 (*)    Hemoglobin 10.8 (*)    HCT 33.5 (*)    RDW 15.6 (*)    All other components within normal limits  BASIC METABOLIC PANEL - Abnormal; Notable for the following components:  Glucose, Bld 102 (*)    Calcium 8.6 (*)    All other components within normal limits  PROTIME-INR  APTT  TYPE AND SCREEN   ____________________________________________  EKG  ED ECG REPORT I, Rudene Re, the attending physician, personally viewed and interpreted this ECG.  Atrial fibrillation with rate of 71, normal QTC, right axis deviation, RBBB, no ST elevations or depressions.  Unchanged from prior from 2015 ____________________________________________  RADIOLOGY  I have personally reviewed the images performed during this visit and I agree with the Radiologist's read.   Interpretation by Radiologist:  Ct Head Wo Contrast  Result Date: 11/02/2018 CLINICAL DATA:  Recent fall. Patient receiving anticoagulation medicine EXAM: CT HEAD WITHOUT CONTRAST TECHNIQUE: Contiguous axial images were obtained from the base of the skull through the vertex without intravenous contrast. COMPARISON:  July 16, 2017 FINDINGS: Brain: Moderate diffuse atrophy is stable. There is no intracranial mass, hemorrhage, extra-axial fluid collection, or midline shift. There is evidence of a prior infarct in the left parietal  lobe, stable. There is patchy small vessel disease in the centra semiovale bilaterally, more severe posteriorly than elsewhere, stable. No acute infarct is demonstrable on this study. Vascular: There is no hyperdense vessel. There is calcification in portions of each carotid siphon region. Skull: Bony calvarium appears intact. There is a small benign exostosis arising from the left frontal bone medially. Sinuses/Orbits: There is opacification in several ethmoid air cells. There is mucosal thickening in ethmoid air cells as well as in each anterior sphenoid sinus and in the medial right maxillary antrum. Orbits appear symmetric bilaterally. Other: Mastoid air cells are clear. There is debris in each external auditory canal. IMPRESSION: Stable atrophy with supratentorial small vessel disease. Prior focal infarct in the left parietal lobe. No acute infarct evident. No mass or hemorrhage. Foci of arterial vascular calcification noted. There are foci of paranasal sinus disease at several sites. There is probable cerumen in each external auditory canal. Electronically Signed   By: Lowella Grip III M.D.   On: 11/02/2018 11:53   Dg Hip Unilat With Pelvis 1v Right  Result Date: 11/02/2018 CLINICAL DATA:  Unwitnessed fall from a wheelchair 2 days ago at nursing home, bruising RIGHT hip and femur EXAM: DG HIP (WITH OR WITHOUT PELVIS) 1V RIGHT COMPARISON:  06/07/2015 FINDINGS: Osseous demineralization. Prior RIGHT acetabular reconstruction with old healed fractures. Post RIGHT hip arthroplasty. Advanced osteoarthritic changes of LEFT hip joint with joint space obliteration, bone-on-bone appearance, subchondral sclerosis and subchondral cyst formation. SI joints grossly preserved. No acute fracture, dislocation, or bone destruction. Brachytherapy seed implants at prostate bed. IMPRESSION: Osseous demineralization with prior RIGHT hip replacement and RIGHT acetabular reconstruction. No acute abnormalities. Advanced  osteoarthritic changes LEFT hip. Electronically Signed   By: Lavonia Dana M.D.   On: 11/02/2018 11:53   Dg Femur 1v Right  Result Date: 11/02/2018 CLINICAL DATA:  Unwitnessed fall from a wheelchair 2 days ago at nursing home, bruising RIGHT hip and femur EXAM: RIGHT FEMUR 1 VIEW COMPARISON:  None FINDINGS: Osseous demineralization. Prior RIGHT hip arthroplasty and RIGHT acetabular reconstruction. Motion artifacts degrade assessment of the knee. No definite fracture or dislocation identified on limited AP exam. Scattered atherosclerotic calcifications. IMPRESSION: Postsurgical changes of RIGHT hip arthroplasty and RIGHT acetabular reconstruction. No definite acute abnormalities on limited exam. Electronically Signed   By: Lavonia Dana M.D.   On: 11/02/2018 11:54     ____________________________________________   PROCEDURES  Procedure(s) performed: None Procedures Critical Care performed:  None ____________________________________________  INITIAL IMPRESSION / ASSESSMENT AND PLAN / ED COURSE  85 y.o. male with a history of dementia, PAD on Plavix, hypertension, hyperlipidemia, anemia who presents for evaluation of fall.  Patient with large hematoma that starts on the right hip area and moves down all the way to his knee.  He is tender to palpation and movement of that leg.  Patient has advanced dementia and is unable to provide any further history.  Since he is on Plavix a head CT will be ordered, x-ray of the pelvis and hip as well.  Will check labs to rule out acute blood loss anemia.  Currently no evidence of compartment syndrome with soft compartments on exam, normal distal pulses and capillary refill    _________________________ 12:37 PM on 11/02/2018 -----------------------------------------  Hgb is 10.2, no labs available since 2016 for comparison. Imaging with no evidence of fracture or dislocation. Head CT negative for intracranial bleed.  Patient remains hemodynamically stable.  At  this time 2 days far out from the fall I believe he is safe to discharge back to his facility. Discussed findings on clinical exam, labs, and imaging with patient's daughter Ms. Starleen Blue and she is in agreement with patient being sent back.  Will recommend Tylenol 1000mg  3 times daily for pain.  Discussed standard return precautions and close follow-up with primary care doctor.   As part of my medical decision making, I reviewed the following data within the [facility] notes reviewed and incorporated, Labs reviewed , EKG interpreted , Old EKG reviewed, Old chart reviewed, Radiograph reviewed , Notes from prior ED visits and [place] Controlled Substance Database    Pertinent labs & imaging results that were available during my care of the patient were reviewed by me and considered in my medical decision making (see chart for details).    ____________________________________________   FINAL CLINICAL IMPRESSION(S) / ED DIAGNOSES  Final diagnoses:  Fall, initial encounter  Hematoma of right lower extremity, initial encounter      NEW MEDICATIONS STARTED DURING THIS VISIT:  ED Discharge Orders    None       Note:  This document was prepared using Dragon voice recognition software and may include unintentional dictation errors.    Alfred Levins, [place], MD 11/02/18 1255

## 2018-11-02 NOTE — ED Notes (Signed)
Awake and alert to baseline.  NAD.  

## 2018-11-02 NOTE — ED Notes (Signed)
[facility]  Called for  Transport  To   [facility]

## 2018-11-02 NOTE — ED Triage Notes (Signed)
Patient arrived by EMS from [facility] for fall X2 days ago. Staff reports unwitnessed fall when patient was trying to go from wheelchair to recliner without assistance. Staff also said no injury was noticed after fall. Nurse Practitioner came to evaluate patient today and saw bruising and sent patient to ER.  Patient has severe bruising(purple and black) on right leg and right growing area. Plavix 70mg  daily. Artificial hip joint right sided. Contracts left lower leg. Full code

## 2018-11-02 NOTE — ED Notes (Signed)
Patient removed peripheral IV of left hand. Site assessed. Catheter intact.

## 2018-11-02 NOTE — Discharge Instructions (Addendum)
You were seen in the emergency department after a fall. Luckily all of your imaging studies did not show any evidence of injuries. Follow-up with you doctor within the next 2-3 days for further evaluation. Sometimes injuries can present at a later time and therefore it is imperative that you return to the emergency room if you have a severe headache, facial droop, neck pain, numbness or weakness of your extremities, slurred speech, difficulty finding words, chest pain, back pain, abdominal pain, or any other new symptoms that were not present during this visit. You may take Tylenol 1000mg  every 8 hours for pain.

## 2018-11-03 DIAGNOSIS — D649 Anemia, unspecified: Secondary | ICD-10-CM | POA: Diagnosis not present

## 2018-11-03 DIAGNOSIS — G309 Alzheimer's disease, unspecified: Secondary | ICD-10-CM | POA: Diagnosis not present

## 2018-11-05 DIAGNOSIS — I5032 Chronic diastolic (congestive) heart failure: Secondary | ICD-10-CM | POA: Diagnosis not present

## 2018-11-05 DIAGNOSIS — F325 Major depressive disorder, single episode, in full remission: Secondary | ICD-10-CM | POA: Diagnosis not present

## 2018-11-05 DIAGNOSIS — G301 Alzheimer's disease with late onset: Secondary | ICD-10-CM | POA: Diagnosis not present

## 2018-11-05 DIAGNOSIS — F0280 Dementia in other diseases classified elsewhere without behavioral disturbance: Secondary | ICD-10-CM | POA: Diagnosis not present

## 2018-11-12 DIAGNOSIS — I739 Peripheral vascular disease, unspecified: Secondary | ICD-10-CM | POA: Diagnosis not present

## 2018-11-12 DIAGNOSIS — D649 Anemia, unspecified: Secondary | ICD-10-CM | POA: Diagnosis not present

## 2018-11-26 DIAGNOSIS — I10 Essential (primary) hypertension: Secondary | ICD-10-CM | POA: Diagnosis not present

## 2018-11-26 DIAGNOSIS — G309 Alzheimer's disease, unspecified: Secondary | ICD-10-CM | POA: Diagnosis not present

## 2018-11-26 DIAGNOSIS — D519 Vitamin B12 deficiency anemia, unspecified: Secondary | ICD-10-CM | POA: Diagnosis not present

## 2018-11-26 DIAGNOSIS — D649 Anemia, unspecified: Secondary | ICD-10-CM | POA: Diagnosis not present

## 2018-11-29 ENCOUNTER — Encounter (INDEPENDENT_AMBULATORY_CARE_PROVIDER_SITE_OTHER): Payer: Medicare HMO

## 2018-11-29 ENCOUNTER — Ambulatory Visit (INDEPENDENT_AMBULATORY_CARE_PROVIDER_SITE_OTHER): Payer: Medicare HMO | Admitting: Nurse Practitioner

## 2018-12-10 ENCOUNTER — Encounter (INDEPENDENT_AMBULATORY_CARE_PROVIDER_SITE_OTHER): Payer: Medicare HMO

## 2018-12-10 ENCOUNTER — Ambulatory Visit (INDEPENDENT_AMBULATORY_CARE_PROVIDER_SITE_OTHER): Payer: Medicare HMO | Admitting: Nurse Practitioner

## 2018-12-11 DIAGNOSIS — F325 Major depressive disorder, single episode, in full remission: Secondary | ICD-10-CM | POA: Diagnosis not present

## 2018-12-11 DIAGNOSIS — I5032 Chronic diastolic (congestive) heart failure: Secondary | ICD-10-CM | POA: Diagnosis not present

## 2018-12-11 DIAGNOSIS — F0280 Dementia in other diseases classified elsewhere without behavioral disturbance: Secondary | ICD-10-CM | POA: Diagnosis not present

## 2018-12-11 DIAGNOSIS — G301 Alzheimer's disease with late onset: Secondary | ICD-10-CM | POA: Diagnosis not present

## 2018-12-11 DIAGNOSIS — Z789 Other specified health status: Secondary | ICD-10-CM | POA: Diagnosis not present

## 2018-12-11 DIAGNOSIS — C61 Malignant neoplasm of prostate: Secondary | ICD-10-CM | POA: Diagnosis not present

## 2018-12-28 DIAGNOSIS — G309 Alzheimer's disease, unspecified: Secondary | ICD-10-CM | POA: Diagnosis not present

## 2018-12-28 DIAGNOSIS — G4700 Insomnia, unspecified: Secondary | ICD-10-CM | POA: Diagnosis not present

## 2018-12-28 DIAGNOSIS — F321 Major depressive disorder, single episode, moderate: Secondary | ICD-10-CM | POA: Diagnosis not present

## 2018-12-28 DIAGNOSIS — F0280 Dementia in other diseases classified elsewhere without behavioral disturbance: Secondary | ICD-10-CM | POA: Diagnosis not present

## 2019-01-30 DIAGNOSIS — Z789 Other specified health status: Secondary | ICD-10-CM | POA: Diagnosis not present

## 2019-01-30 DIAGNOSIS — C61 Malignant neoplasm of prostate: Secondary | ICD-10-CM | POA: Diagnosis not present

## 2019-01-30 DIAGNOSIS — F325 Major depressive disorder, single episode, in full remission: Secondary | ICD-10-CM | POA: Diagnosis not present

## 2019-01-30 DIAGNOSIS — I5032 Chronic diastolic (congestive) heart failure: Secondary | ICD-10-CM | POA: Diagnosis not present

## 2019-01-30 DIAGNOSIS — F0280 Dementia in other diseases classified elsewhere without behavioral disturbance: Secondary | ICD-10-CM | POA: Diagnosis not present

## 2019-01-30 DIAGNOSIS — G301 Alzheimer's disease with late onset: Secondary | ICD-10-CM | POA: Diagnosis not present

## 2019-02-05 ENCOUNTER — Encounter (INDEPENDENT_AMBULATORY_CARE_PROVIDER_SITE_OTHER): Payer: Medicare HMO

## 2019-02-05 ENCOUNTER — Ambulatory Visit (INDEPENDENT_AMBULATORY_CARE_PROVIDER_SITE_OTHER): Payer: Medicare HMO | Admitting: Nurse Practitioner

## 2019-02-07 DIAGNOSIS — Z20828 Contact with and (suspected) exposure to other viral communicable diseases: Secondary | ICD-10-CM | POA: Diagnosis not present

## 2019-02-15 DIAGNOSIS — Z20828 Contact with and (suspected) exposure to other viral communicable diseases: Secondary | ICD-10-CM | POA: Diagnosis not present

## 2019-02-20 DIAGNOSIS — Z03818 Encounter for observation for suspected exposure to other biological agents ruled out: Secondary | ICD-10-CM | POA: Diagnosis not present

## 2019-02-22 ENCOUNTER — Other Ambulatory Visit: Payer: Self-pay

## 2019-02-22 ENCOUNTER — Ambulatory Visit (INDEPENDENT_AMBULATORY_CARE_PROVIDER_SITE_OTHER): Payer: Medicare HMO | Admitting: Nurse Practitioner

## 2019-02-22 ENCOUNTER — Ambulatory Visit (INDEPENDENT_AMBULATORY_CARE_PROVIDER_SITE_OTHER): Payer: Medicare HMO

## 2019-02-22 ENCOUNTER — Encounter (INDEPENDENT_AMBULATORY_CARE_PROVIDER_SITE_OTHER): Payer: Self-pay

## 2019-02-22 ENCOUNTER — Encounter (INDEPENDENT_AMBULATORY_CARE_PROVIDER_SITE_OTHER): Payer: Self-pay | Admitting: Nurse Practitioner

## 2019-02-22 VITALS — BP 130/74 | HR 45 | Resp 14

## 2019-02-22 DIAGNOSIS — M1990 Unspecified osteoarthritis, unspecified site: Secondary | ICD-10-CM | POA: Diagnosis not present

## 2019-02-22 DIAGNOSIS — G309 Alzheimer's disease, unspecified: Secondary | ICD-10-CM

## 2019-02-22 DIAGNOSIS — I70235 Atherosclerosis of native arteries of right leg with ulceration of other part of foot: Secondary | ICD-10-CM

## 2019-02-22 DIAGNOSIS — Z79899 Other long term (current) drug therapy: Secondary | ICD-10-CM

## 2019-02-22 DIAGNOSIS — I7025 Atherosclerosis of native arteries of other extremities with ulceration: Secondary | ICD-10-CM

## 2019-02-22 DIAGNOSIS — L97919 Non-pressure chronic ulcer of unspecified part of right lower leg with unspecified severity: Secondary | ICD-10-CM | POA: Diagnosis not present

## 2019-02-22 DIAGNOSIS — L97519 Non-pressure chronic ulcer of other part of right foot with unspecified severity: Secondary | ICD-10-CM | POA: Diagnosis not present

## 2019-02-22 DIAGNOSIS — Z791 Long term (current) use of non-steroidal anti-inflammatories (NSAID): Secondary | ICD-10-CM

## 2019-02-22 DIAGNOSIS — F0280 Dementia in other diseases classified elsewhere without behavioral disturbance: Secondary | ICD-10-CM

## 2019-02-26 DIAGNOSIS — Z20828 Contact with and (suspected) exposure to other viral communicable diseases: Secondary | ICD-10-CM | POA: Diagnosis not present

## 2019-02-26 DIAGNOSIS — B342 Coronavirus infection, unspecified: Secondary | ICD-10-CM | POA: Diagnosis not present

## 2019-03-01 DIAGNOSIS — R4189 Other symptoms and signs involving cognitive functions and awareness: Secondary | ICD-10-CM | POA: Diagnosis not present

## 2019-03-01 DIAGNOSIS — F321 Major depressive disorder, single episode, moderate: Secondary | ICD-10-CM | POA: Diagnosis not present

## 2019-03-02 ENCOUNTER — Encounter (INDEPENDENT_AMBULATORY_CARE_PROVIDER_SITE_OTHER): Payer: Self-pay | Admitting: Nurse Practitioner

## 2019-03-02 NOTE — Progress Notes (Signed)
SUBJECTIVE:  Patient ID: Kenneth Knight, male    DOB: 02/15/1932, 85 y.o.   MRN: [id number] Chief Complaint  Patient presents with  . Follow-up    73month ultrasound     HPI  Kenneth Knight is a 85 y.o. male that presents today for follow-up of peripheral arterial disease.  The patient has advanced Alzheimer's and was uncooperative with the noninvasive studies today.  His daughter and caretaker from the facility are present today and provides most of the history.  They report that he denies any pain of the lower extremities or complaints of pain generally.  The patient has a small wound on the right fifth toe, however it is the size of a pinpoint and appears to be mostly scab.  The patient seems to keep left leg for a examiner.  He was mostly nonverbal during this visit.  Today the patient was able to obtain a duplex of the right lower extremity which revealed triphasic waveforms down to the level of the tibial arteries were a transition to monophasic waveforms.  The left lower extremity was unable to be imaged due to patient being still   Past Medical History:  Diagnosis Date  . Alzheimer's dementia ([place])   . Anemia   . Cancer ([place])   . Falls   . Hyperlipidemia   . Hypertension   . Major depressive disorder     Past Surgical History:  Procedure Laterality Date  . HIP SURGERY     right hip replacement  . LOWER EXTREMITY ANGIOGRAPHY Right 02/01/2018   Procedure: LOWER EXTREMITY ANGIOGRAPHY;  Surgeon: Algernon Huxley, MD;  Location: [facility];  Service: Cardiovascular;  Laterality: Right;    Social History   Socioeconomic History  . Marital status: Married    Spouse name: Not on file  . Number of children: Not on file  . Years of education: Not on file  . Highest education level: Not on file  Occupational History  . Not on file  Social Needs  . Financial resource strain: Not on file  . Food insecurity    Worry: Not on file    Inability: Not on file  .  Transportation needs    Medical: Not on file    Non-medical: Not on file  Tobacco Use  . Smoking status: Never Smoker  . Smokeless tobacco: Never Used  Substance and Sexual Activity  . Alcohol use: No  . Drug use: No  . Sexual activity: Not on file  Lifestyle  . Physical activity    Days per week: Not on file    Minutes per session: Not on file  . Stress: Not on file  Relationships  . Social Herbalist on phone: Not on file    Gets together: Not on file    Attends religious service: Not on file    Active member of club or organization: Not on file    Attends meetings of clubs or organizations: Not on file    Relationship status: Not on file  . Intimate partner violence    Fear of current or ex partner: Not on file    Emotionally abused: Not on file    Physically abused: Not on file    Forced sexual activity: Not on file  Other Topics Concern  . Not on file  Social History Narrative  . Not on file    Family History  Problem Relation Age of Onset  . Diabetes Sister   .  Heart attack Brother   . Heart disease Brother   . Hypertension Brother     No Known Allergies   Review of Systems   Review of Systems: Negative Unless Checked Constitutional: [] Weight loss  [] Fever  [] Chills Cardiac: [] Chest pain   []  Atrial Fibrillation  [] Palpitations   [] Shortness of breath when laying flat   [] Shortness of breath with exertion. [] Shortness of breath at rest Vascular:  [] Pain in legs with walking   [] Pain in legs with standing [] Pain in legs when laying flat   [] Claudication    [] Pain in feet when laying flat    [] History of DVT   [] Phlebitis   [] Swelling in legs   [] Varicose veins   [] Non-healing ulcers Pulmonary:   [] Uses home oxygen   [] Productive cough   [] Hemoptysis   [] Wheeze  [] COPD   [] Asthma Neurologic:  [] Dizziness   [] Seizures  [] Blackouts [] History of stroke   [] History of TIA  [] Aphasia   [] Temporary Blindness   [] Weakness or numbness in arm   [] Weakness or  numbness in leg Musculoskeletal:   [] Joint swelling   [] Joint pain   [] Low back pain  []  History of Knee Replacement [x] Arthritis [] back Surgeries  []  Spinal Stenosis    Hematologic:  [] Easy bruising  [] Easy bleeding   [] Hypercoagulable state   [x] Anemic Gastrointestinal:  [] Diarrhea   [] Vomiting  [] Gastroesophageal reflux/heartburn   [] Difficulty swallowing. [] Abdominal pain Genitourinary:  [] Chronic kidney disease   [] Difficult urination  [] Anuric   [] Blood in urine [] Frequent urination  [] Burning with urination   [] Hematuria Skin:  [] Rashes   [] Ulcers [x] Wounds Psychological:  [] History of anxiety   []  History of major depression  [x]  Memory Difficulties      OBJECTIVE:   Physical Exam  BP 130/74 (BP Location: Right Arm)   Pulse (!) 45   Resp 14   Gen: WD/WN, NAD. Non verbal  Head: [place]/AT, No temporalis wasting.  Ear/Nose/Throat: Hearing grossly intact, nares w/o erythema or drainage Eyes: PER, EOMI, sclera nonicteric.  Neck: Supple, no masses.  No JVD.  Pulmonary:  Good air movement, no use of accessory muscles.  Cardiac: RRR Vascular:  Vessel Right Left  Radial Palpable Palpable  Dorsalis Pedis Palpable Palpable  Posterior Tibial Palpable Palpable   Gastrointestinal: soft, non-distended. No guarding/no peritoneal signs.  Musculoskeletal: M/S 5/5 throughout.  No deformity or atrophy.  Neurologic: Pain and light touch intact in extremities.  Symmetrical.  Speech is fluent. Motor exam as listed above. Psychiatric: Judgment intact, Mood & affect appropriate for pt's clinical situation. Dermatologic: No Venous rashes. No Ulcers Noted.  No changes consistent with cellulitis. Lymph : No Cervical lymphadenopathy, no lichenification or skin changes of chronic lymphedema.       ASSESSMENT AND PLAN:  1. Atherosclerosis of native arteries of the extremities with ulceration ([place]) The patient does have a small wound on the right fifth toe.  It does not appear to be gangrenous or  necrotic or covered with eschar versus merely a scab.  However I have sent instructions to the nursing facility to closely monitor this area and she did begin to ooze, growing in size, or worsen they should contact her office immediately.  Otherwise, we will have the patient follow-up in 3 months with noninvasive studies.  After discussions with the daughter we plan to try to keep the patient's treatments as conservative as possible. - VAS [place] LOWER EXTREMITY ARTERIAL DUPLEX; Future  2. Arthritis Continue NSAID medications as already ordered, these medications have been reviewed  and there are no changes at this time.  Continued activity and therapy was stressed.   3. Alzheimer's dementia without behavioral disturbance, unspecified timing of dementia onset ([place]) Patient's exam today was limited due to his cognitive status.  Also based on this, and discussion with the daughter, we feel it is best to strive for conservative treatment therapies as much as possible   Current Outpatient Medications on File Prior to Visit  Medication Sig Dispense Refill  . acetaminophen (TYLENOL) 500 MG tablet Take 500 mg by mouth every 6 (six) hours as needed.    Marland Kitchen aspirin 81 MG chewable tablet Chew 81 mg by mouth daily.    Marland Kitchen atorvastatin (LIPITOR) 10 MG tablet Take 1 tablet (10 mg total) by mouth daily. 30 tablet 11  . Calcium Polycarbophil (FIBER-LAX PO) Take by mouth.    . cetirizine (ZYRTEC) 10 MG tablet Take 10 mg by mouth daily.    . Cholecalciferol (VITAMIN D PO) Take by mouth.    . ciclopirox (PENLAC) 8 % solution Apply topically at bedtime. Apply over nail and surrounding skin. Apply daily over previous coat. After seven (7) days, may remove with alcohol and continue cycle.    . citalopram (CELEXA) 20 MG tablet     . donepezil (ARICEPT) 10 MG tablet     . Melatonin 3 MG TABS Take by mouth at bedtime.    . memantine (NAMENDA) 10 MG tablet     . VITAMIN E PO Take by mouth.    Marland Kitchen amLODipine (NORVASC) 10 MG  tablet     . Ascorbic Acid (VITAMIN C PO) Take by mouth.    . clopidogrel (PLAVIX) 75 MG tablet Take 1 tablet (75 mg total) by mouth daily. (Patient not taking: Reported on 02/22/2019) 30 tablet 11  . COCONUT OIL PO Take by mouth.    . Cyanocobalamin (VITAMIN B-12 PO) Take by mouth.    . loratadine (CLARITIN) 10 MG tablet Take 10 mg by mouth daily.    . mirtazapine (REMERON) 15 MG tablet      No current facility-administered medications on file prior to visit.     There are no Patient Instructions on file for this visit. No follow-ups on file.   Kris Hartmann, NP  This note was completed with Sales executive.  Any errors are purely unintentional.

## 2019-03-20 DIAGNOSIS — C61 Malignant neoplasm of prostate: Secondary | ICD-10-CM | POA: Diagnosis not present

## 2019-03-20 DIAGNOSIS — F325 Major depressive disorder, single episode, in full remission: Secondary | ICD-10-CM | POA: Diagnosis not present

## 2019-03-20 DIAGNOSIS — F0280 Dementia in other diseases classified elsewhere without behavioral disturbance: Secondary | ICD-10-CM | POA: Diagnosis not present

## 2019-03-20 DIAGNOSIS — Z789 Other specified health status: Secondary | ICD-10-CM | POA: Diagnosis not present

## 2019-03-20 DIAGNOSIS — G301 Alzheimer's disease with late onset: Secondary | ICD-10-CM | POA: Diagnosis not present

## 2019-03-20 DIAGNOSIS — I5032 Chronic diastolic (congestive) heart failure: Secondary | ICD-10-CM | POA: Diagnosis not present

## 2019-03-26 DIAGNOSIS — I10 Essential (primary) hypertension: Secondary | ICD-10-CM | POA: Diagnosis not present

## 2019-03-26 DIAGNOSIS — E785 Hyperlipidemia, unspecified: Secondary | ICD-10-CM | POA: Diagnosis not present

## 2019-03-26 DIAGNOSIS — D649 Anemia, unspecified: Secondary | ICD-10-CM | POA: Diagnosis not present

## 2019-05-10 DIAGNOSIS — F321 Major depressive disorder, single episode, moderate: Secondary | ICD-10-CM | POA: Diagnosis not present

## 2019-05-10 DIAGNOSIS — R4189 Other symptoms and signs involving cognitive functions and awareness: Secondary | ICD-10-CM | POA: Diagnosis not present

## 2019-05-24 DIAGNOSIS — Z789 Other specified health status: Secondary | ICD-10-CM | POA: Diagnosis not present

## 2019-05-24 DIAGNOSIS — I5032 Chronic diastolic (congestive) heart failure: Secondary | ICD-10-CM | POA: Diagnosis not present

## 2019-05-24 DIAGNOSIS — F325 Major depressive disorder, single episode, in full remission: Secondary | ICD-10-CM | POA: Diagnosis not present

## 2019-05-24 DIAGNOSIS — C61 Malignant neoplasm of prostate: Secondary | ICD-10-CM | POA: Diagnosis not present

## 2019-05-24 DIAGNOSIS — G301 Alzheimer's disease with late onset: Secondary | ICD-10-CM | POA: Diagnosis not present

## 2019-05-24 DIAGNOSIS — F0280 Dementia in other diseases classified elsewhere without behavioral disturbance: Secondary | ICD-10-CM | POA: Diagnosis not present

## 2019-05-29 ENCOUNTER — Ambulatory Visit (INDEPENDENT_AMBULATORY_CARE_PROVIDER_SITE_OTHER): Payer: Medicare HMO | Admitting: Nurse Practitioner

## 2019-05-29 ENCOUNTER — Encounter (INDEPENDENT_AMBULATORY_CARE_PROVIDER_SITE_OTHER): Payer: Self-pay | Admitting: Nurse Practitioner

## 2019-05-29 ENCOUNTER — Other Ambulatory Visit: Payer: Self-pay

## 2019-05-29 ENCOUNTER — Encounter (INDEPENDENT_AMBULATORY_CARE_PROVIDER_SITE_OTHER): Payer: Self-pay

## 2019-05-29 ENCOUNTER — Other Ambulatory Visit (INDEPENDENT_AMBULATORY_CARE_PROVIDER_SITE_OTHER): Payer: Self-pay | Admitting: Nurse Practitioner

## 2019-05-29 ENCOUNTER — Ambulatory Visit (INDEPENDENT_AMBULATORY_CARE_PROVIDER_SITE_OTHER): Payer: Medicare HMO

## 2019-05-29 VITALS — BP 166/76 | HR 80 | Resp 12 | Ht 65.0 in | Wt 180.0 lb

## 2019-05-29 DIAGNOSIS — I7025 Atherosclerosis of native arteries of other extremities with ulceration: Secondary | ICD-10-CM | POA: Diagnosis not present

## 2019-05-29 DIAGNOSIS — M1990 Unspecified osteoarthritis, unspecified site: Secondary | ICD-10-CM

## 2019-05-29 DIAGNOSIS — R2689 Other abnormalities of gait and mobility: Secondary | ICD-10-CM | POA: Insufficient documentation

## 2019-05-29 DIAGNOSIS — F325 Major depressive disorder, single episode, in full remission: Secondary | ICD-10-CM | POA: Insufficient documentation

## 2019-05-29 DIAGNOSIS — I10 Essential (primary) hypertension: Secondary | ICD-10-CM

## 2019-05-29 DIAGNOSIS — C61 Malignant neoplasm of prostate: Secondary | ICD-10-CM | POA: Insufficient documentation

## 2019-05-29 DIAGNOSIS — I739 Peripheral vascular disease, unspecified: Secondary | ICD-10-CM | POA: Diagnosis not present

## 2019-05-29 DIAGNOSIS — T148XXA Other injury of unspecified body region, initial encounter: Secondary | ICD-10-CM | POA: Insufficient documentation

## 2019-06-02 ENCOUNTER — Encounter (INDEPENDENT_AMBULATORY_CARE_PROVIDER_SITE_OTHER): Payer: Self-pay | Admitting: Nurse Practitioner

## 2019-06-02 NOTE — Progress Notes (Signed)
SUBJECTIVE:  Patient ID: Kenneth Knight, male    DOB: August 21, 1931, 85 y.o.   MRN: [id number] Chief Complaint  Patient presents with  . Follow-up    HPI  Kenneth Knight is a 85 y.o. male that presents today for noninvasive studies for peripheral artery disease.  The patient has a history of advanced Alzheimer dementia as well as prostate cancer.  Today the patient is not verbal and able to provide a history however his daughter is currently present.  She denies any complaints of pain or any issues with her father's lower extremities.  Currently, the patient has a small wound on his right small toe however it appears to be superficial and scabbed over.  No evidence of necrotic or gangrene tissue.  The patient is noncooperative when showing his left foot.  The patient's daughter denies any wounds ulcerations or reports of rest pain at the nursing facility.  Currently, there is no treatment for his prostate cancer as the patient's dementia is advanced and treatment is conservative as possible.  Today the patient underwent bilateral ABIs.  His right is 0.51 compared to the previous ABI 1.19.  His left is 1.12 compared to previously of 1.33 on 03/19/2018.  The patient has monophasic waveforms in the right anterior tibial artery, with no waveforms detected in the peroneal and posterior tibial artery.  The patient has biphasic waveforms in his left anterior tibial artery with monophasic in his posterior tibial artery.  Past Medical History:  Diagnosis Date  . Alzheimer's dementia ([place])   . Anemia   . Cancer ([place])   . Falls   . Hyperlipidemia   . Hypertension   . Major depressive disorder     Past Surgical History:  Procedure Laterality Date  . HIP SURGERY     right hip replacement  . LOWER EXTREMITY ANGIOGRAPHY Right 02/01/2018   Procedure: LOWER EXTREMITY ANGIOGRAPHY;  Surgeon: Algernon Huxley, MD;  Location: [facility];  Service: Cardiovascular;  Laterality: Right;    Social  History   Socioeconomic History  . Marital status: Married    Spouse name: Not on file  . Number of children: Not on file  . Years of education: Not on file  . Highest education level: Not on file  Occupational History  . Not on file  Social Needs  . Financial resource strain: Not on file  . Food insecurity    Worry: Not on file    Inability: Not on file  . Transportation needs    Medical: Not on file    Non-medical: Not on file  Tobacco Use  . Smoking status: Never Smoker  . Smokeless tobacco: Never Used  Substance and Sexual Activity  . Alcohol use: No  . Drug use: No  . Sexual activity: Not on file  Lifestyle  . Physical activity    Days per week: Not on file    Minutes per session: Not on file  . Stress: Not on file  Relationships  . Social Herbalist on phone: Not on file    Gets together: Not on file    Attends religious service: Not on file    Active member of club or organization: Not on file    Attends meetings of clubs or organizations: Not on file    Relationship status: Not on file  . Intimate partner violence    Fear of current or ex partner: Not on file    Emotionally abused: Not  on file    Physically abused: Not on file    Forced sexual activity: Not on file  Other Topics Concern  . Not on file  Social History Narrative  . Not on file    Family History  Problem Relation Age of Onset  . Diabetes Sister   . Heart attack Brother   . Heart disease Brother   . Hypertension Brother     No Known Allergies   Review of Systems   Review of Systems: Negative Unless Checked Constitutional: [] Weight loss  [] Fever  [] Chills Cardiac: [] Chest pain   []  Atrial Fibrillation  [] Palpitations   [] Shortness of breath when laying flat   [] Shortness of breath with exertion. [] Shortness of breath at rest Vascular:  [] Pain in legs with walking   [] Pain in legs with standing [] Pain in legs when laying flat   [] Claudication    [] Pain in feet when laying  flat    [] History of DVT   [] Phlebitis   [] Swelling in legs   [] Varicose veins   [] Non-healing ulcers Pulmonary:   [] Uses home oxygen   [] Productive cough   [] Hemoptysis   [] Wheeze  [] COPD   [] Asthma Neurologic:  [] Dizziness   [] Seizures  [] Blackouts [] History of stroke   [] History of TIA  [] Aphasia   [] Temporary Blindness   [] Weakness or numbness in arm   [] Weakness or numbness in leg Musculoskeletal:   [] Joint swelling   [] Joint pain   [] Low back pain  []  History of Knee Replacement [] Arthritis [] back Surgeries  []  Spinal Stenosis    Hematologic:  [] Easy bruising  [] Easy bleeding   [] Hypercoagulable state   [x] Anemic Gastrointestinal:  [] Diarrhea   [] Vomiting  [] Gastroesophageal reflux/heartburn   [] Difficulty swallowing. [] Abdominal pain Genitourinary:  [] Chronic kidney disease   [] Difficult urination  [] Anuric   [] Blood in urine [] Frequent urination  [] Burning with urination   [] Hematuria Skin:  [] Rashes   [] Ulcers [x] Wounds Psychological:  [] History of anxiety   [x]  History of major depression  [x]  Memory Difficulties      OBJECTIVE:   Physical Exam  BP (!) 166/76 (BP Location: Left Arm, Patient Position: Sitting, Cuff Size: Normal)   Pulse 80   Resp 12   Ht 5\' 5"  (1.651 m)   Wt 180 lb (81.6 kg)   BMI 29.95 kg/m   Gen: WD/WN, NAD Head: [place]/AT, No temporalis wasting.  Ear/Nose/Throat: Hearing grossly intact, nares w/o erythema or drainage Eyes: PER, EOMI, sclera nonicteric.  Neck: Supple, no masses.  No JVD.  Pulmonary:  Good air movement, no use of accessory muscles.  Cardiac: RRR Vascular:  2+ edema bilaterally, unable to palpate left due to patient not allowing shoe removal Vessel Right Left  Radial Palpable Palpable  Dorsalis Pedis Not Palpable   Posterior Tibial Not Palpable    Gastrointestinal: soft, non-distended. No guarding/no peritoneal signs.  Musculoskeletal: Patient wheelchair-bound.  No deformity or atrophy.  Neurologic: Pain and light touch intact in  extremities.  Symmetrical.    Patient nonverbal. Motor exam as listed above. Psychiatric: Advanced dementia, Mood & affect appropriate for pt's clinical situation. Dermatologic: No Venous rashes. No Ulcers Noted.  No changes consistent with cellulitis. Lymph : No Cervical lymphadenopathy, no lichenification or skin changes of chronic lymphedema.       ASSESSMENT AND PLAN:  1. Peripheral vascular disease ([place]) This time we will continue with conservative management of the patient's peripheral vascular disease.  This is in line with the family's wishes to not do any invasive treatment unless  it becomes absolutely necessary.  I have discussed with the daughter what things may indicate ischemia or a limb threatening situation.  I also sent a detailed instructions to the patient's nursing facility what things to look for and in which instances the patient should either have an urgent visit or be taken to the emergency room.  This includes things such as color changes of the toes or lower extremities, new wounds or ulcerations that do not heal and get worse over time, especially of pain in the lower extremities by the patient.  We will have the patient follow-up in 6 months with noninvasive studies however they will follow-up sooner if the patient has issues that may need attention.  2. Essential hypertension Conutinue antihypertensive medications as already ordered, these medications have been reviewed and there are no changes at this time.   3. Arthritis Continue NSAID medications as already ordered, these medications have been reviewed and there are no changes at this time.  Continued activity and therapy was stressed.    Current Outpatient Medications on File Prior to Visit  Medication Sig Dispense Refill  . acetaminophen (TYLENOL) 500 MG tablet Take 500 mg by mouth every 6 (six) hours as needed.    Marland Kitchen aspirin 81 MG chewable tablet Chew 81 mg by mouth daily.    Marland Kitchen atorvastatin (LIPITOR) 10 MG  tablet Take 1 tablet (10 mg total) by mouth daily. 30 tablet 11  . Cholecalciferol (VITAMIN D PO) Take by mouth.    . citalopram (CELEXA) 20 MG tablet     . donepezil (ARICEPT) 10 MG tablet     . Melatonin 3 MG TABS Take by mouth at bedtime.    . memantine (NAMENDA) 10 MG tablet     . VITAMIN E PO Take by mouth.     No current facility-administered medications on file prior to visit.     There are no Patient Instructions on file for this visit. No follow-ups on file.   Kris Hartmann, NP  This note was completed with Sales executive.  Any errors are purely unintentional.

## 2019-06-06 DIAGNOSIS — E559 Vitamin D deficiency, unspecified: Secondary | ICD-10-CM | POA: Diagnosis not present

## 2019-07-15 DIAGNOSIS — R4189 Other symptoms and signs involving cognitive functions and awareness: Secondary | ICD-10-CM | POA: Diagnosis not present

## 2019-07-15 DIAGNOSIS — F321 Major depressive disorder, single episode, moderate: Secondary | ICD-10-CM | POA: Diagnosis not present

## 2019-07-23 ENCOUNTER — Other Ambulatory Visit: Payer: Self-pay

## 2019-07-23 ENCOUNTER — Inpatient Hospital Stay
Admission: EM | Admit: 2019-07-23 | Discharge: 2019-07-26 | DRG: 640 | Disposition: A | Discharge status: Hospice | Payer: Medicare HMO | Attending: Internal Medicine | Admitting: Internal Medicine

## 2019-07-23 ENCOUNTER — Emergency Department: Payer: Medicare HMO

## 2019-07-23 DIAGNOSIS — I998 Other disorder of circulatory system: Secondary | ICD-10-CM | POA: Diagnosis not present

## 2019-07-23 DIAGNOSIS — Z96641 Presence of right artificial hip joint: Secondary | ICD-10-CM | POA: Diagnosis present

## 2019-07-23 DIAGNOSIS — J189 Pneumonia, unspecified organism: Secondary | ICD-10-CM | POA: Diagnosis not present

## 2019-07-23 DIAGNOSIS — F329 Major depressive disorder, single episode, unspecified: Secondary | ICD-10-CM | POA: Diagnosis present

## 2019-07-23 DIAGNOSIS — R5381 Other malaise: Secondary | ICD-10-CM | POA: Diagnosis not present

## 2019-07-23 DIAGNOSIS — E860 Dehydration: Secondary | ICD-10-CM | POA: Diagnosis present

## 2019-07-23 DIAGNOSIS — Z7189 Other specified counseling: Secondary | ICD-10-CM

## 2019-07-23 DIAGNOSIS — Z20828 Contact with and (suspected) exposure to other viral communicable diseases: Secondary | ICD-10-CM | POA: Diagnosis not present

## 2019-07-23 DIAGNOSIS — Z7401 Bed confinement status: Secondary | ICD-10-CM | POA: Diagnosis not present

## 2019-07-23 DIAGNOSIS — I5032 Chronic diastolic (congestive) heart failure: Secondary | ICD-10-CM | POA: Diagnosis not present

## 2019-07-23 DIAGNOSIS — R6889 Other general symptoms and signs: Secondary | ICD-10-CM | POA: Diagnosis present

## 2019-07-23 DIAGNOSIS — Z79899 Other long term (current) drug therapy: Secondary | ICD-10-CM | POA: Diagnosis not present

## 2019-07-23 DIAGNOSIS — Z833 Family history of diabetes mellitus: Secondary | ICD-10-CM | POA: Diagnosis not present

## 2019-07-23 DIAGNOSIS — R319 Hematuria, unspecified: Secondary | ICD-10-CM | POA: Diagnosis not present

## 2019-07-23 DIAGNOSIS — R532 Functional quadriplegia: Secondary | ICD-10-CM | POA: Diagnosis not present

## 2019-07-23 DIAGNOSIS — E878 Other disorders of electrolyte and fluid balance, not elsewhere classified: Secondary | ICD-10-CM | POA: Diagnosis present

## 2019-07-23 DIAGNOSIS — R404 Transient alteration of awareness: Secondary | ICD-10-CM | POA: Diagnosis not present

## 2019-07-23 DIAGNOSIS — E785 Hyperlipidemia, unspecified: Secondary | ICD-10-CM | POA: Diagnosis present

## 2019-07-23 DIAGNOSIS — M2550 Pain in unspecified joint: Secondary | ICD-10-CM | POA: Diagnosis not present

## 2019-07-23 DIAGNOSIS — G309 Alzheimer's disease, unspecified: Secondary | ICD-10-CM | POA: Diagnosis present

## 2019-07-23 DIAGNOSIS — R0902 Hypoxemia: Secondary | ICD-10-CM | POA: Diagnosis present

## 2019-07-23 DIAGNOSIS — E872 Acidosis, unspecified: Secondary | ICD-10-CM

## 2019-07-23 DIAGNOSIS — N390 Urinary tract infection, site not specified: Secondary | ICD-10-CM | POA: Diagnosis present

## 2019-07-23 DIAGNOSIS — E162 Hypoglycemia, unspecified: Secondary | ICD-10-CM | POA: Diagnosis present

## 2019-07-23 DIAGNOSIS — R945 Abnormal results of liver function studies: Secondary | ICD-10-CM | POA: Diagnosis present

## 2019-07-23 DIAGNOSIS — Z7982 Long term (current) use of aspirin: Secondary | ICD-10-CM | POA: Diagnosis not present

## 2019-07-23 DIAGNOSIS — N179 Acute kidney failure, unspecified: Secondary | ICD-10-CM | POA: Diagnosis not present

## 2019-07-23 DIAGNOSIS — I110 Hypertensive heart disease with heart failure: Secondary | ICD-10-CM | POA: Diagnosis present

## 2019-07-23 DIAGNOSIS — R4182 Altered mental status, unspecified: Secondary | ICD-10-CM | POA: Diagnosis not present

## 2019-07-23 DIAGNOSIS — F0390 Unspecified dementia without behavioral disturbance: Secondary | ICD-10-CM | POA: Diagnosis present

## 2019-07-23 DIAGNOSIS — R0602 Shortness of breath: Secondary | ICD-10-CM | POA: Diagnosis not present

## 2019-07-23 DIAGNOSIS — E870 Hyperosmolality and hypernatremia: Secondary | ICD-10-CM | POA: Diagnosis not present

## 2019-07-23 DIAGNOSIS — N3000 Acute cystitis without hematuria: Secondary | ICD-10-CM | POA: Diagnosis present

## 2019-07-23 DIAGNOSIS — Z8249 Family history of ischemic heart disease and other diseases of the circulatory system: Secondary | ICD-10-CM

## 2019-07-23 DIAGNOSIS — Z515 Encounter for palliative care: Secondary | ICD-10-CM | POA: Diagnosis not present

## 2019-07-23 DIAGNOSIS — Z66 Do not resuscitate: Secondary | ICD-10-CM | POA: Diagnosis present

## 2019-07-23 DIAGNOSIS — E871 Hypo-osmolality and hyponatremia: Secondary | ICD-10-CM | POA: Diagnosis present

## 2019-07-23 DIAGNOSIS — D649 Anemia, unspecified: Secondary | ICD-10-CM | POA: Diagnosis not present

## 2019-07-23 DIAGNOSIS — G9341 Metabolic encephalopathy: Secondary | ICD-10-CM | POA: Diagnosis present

## 2019-07-23 DIAGNOSIS — Z8546 Personal history of malignant neoplasm of prostate: Secondary | ICD-10-CM

## 2019-07-23 DIAGNOSIS — C61 Malignant neoplasm of prostate: Secondary | ICD-10-CM | POA: Diagnosis present

## 2019-07-23 DIAGNOSIS — F0280 Dementia in other diseases classified elsewhere without behavioral disturbance: Secondary | ICD-10-CM | POA: Diagnosis present

## 2019-07-23 DIAGNOSIS — I70201 Unspecified atherosclerosis of native arteries of extremities, right leg: Secondary | ICD-10-CM | POA: Diagnosis present

## 2019-07-23 DIAGNOSIS — I70211 Atherosclerosis of native arteries of extremities with intermittent claudication, right leg: Secondary | ICD-10-CM | POA: Diagnosis not present

## 2019-07-23 DIAGNOSIS — R7989 Other specified abnormal findings of blood chemistry: Secondary | ICD-10-CM | POA: Diagnosis present

## 2019-07-23 LAB — CBC WITH DIFFERENTIAL/PLATELET
Abs Immature Granulocytes: 0.06 10*3/uL (ref 0.00–0.07)
Basophils Absolute: 0.1 10*3/uL (ref 0.0–0.1)
Basophils Relative: 1 %
Eosinophils Absolute: 0.1 10*3/uL (ref 0.0–0.5)
Eosinophils Relative: 1 %
HCT: 53.3 % — ABNORMAL HIGH (ref 39.0–52.0)
Hemoglobin: 16.4 g/dL (ref 13.0–17.0)
Immature Granulocytes: 1 %
Lymphocytes Relative: 9 %
Lymphs Abs: 1 10*3/uL (ref 0.7–4.0)
MCH: 32.1 pg (ref 26.0–34.0)
MCHC: 30.8 g/dL (ref 30.0–36.0)
MCV: 104.3 fL — ABNORMAL HIGH (ref 80.0–100.0)
Monocytes Absolute: 0.8 10*3/uL (ref 0.1–1.0)
Monocytes Relative: 7 %
Neutro Abs: 9.3 10*3/uL — ABNORMAL HIGH (ref 1.7–7.7)
Neutrophils Relative %: 81 %
Platelets: 156 10*3/uL (ref 150–400)
RBC: 5.11 MIL/uL (ref 4.22–5.81)
RDW: 15 % (ref 11.5–15.5)
WBC: 11.3 10*3/uL — ABNORMAL HIGH (ref 4.0–10.5)
nRBC: 0.3 % — ABNORMAL HIGH (ref 0.0–0.2)

## 2019-07-23 LAB — BASIC METABOLIC PANEL
BUN: 56 mg/dL — ABNORMAL HIGH (ref 8–23)
CO2: 26 mmol/L (ref 22–32)
Calcium: 9.2 mg/dL (ref 8.9–10.3)
Chloride: >130 mmol/L (ref 98–111)
Creatinine, Ser: 1.58 mg/dL — ABNORMAL HIGH (ref 0.61–1.24)
GFR calc Af Amer: 46 mL/min — ABNORMAL LOW (ref >60)
GFR calc non Af Amer: 39 mL/min — ABNORMAL LOW (ref >60)
Glucose, Bld: 182 mg/dL — ABNORMAL HIGH (ref 70–99)
Potassium: 3.7 mmol/L (ref 3.5–5.1)
Sodium: 168 mmol/L (ref 135–145)

## 2019-07-23 LAB — HEPATIC FUNCTION PANEL
ALT: 64 U/L — ABNORMAL HIGH (ref 0–44)
AST: 48 U/L — ABNORMAL HIGH (ref 15–41)
Albumin: 2.9 g/dL — ABNORMAL LOW (ref 3.5–5.0)
Alkaline Phosphatase: 226 U/L — ABNORMAL HIGH (ref 38–126)
Bilirubin, Direct: 0.2 mg/dL (ref 0.0–0.2)
Indirect Bilirubin: 0.6 mg/dL (ref 0.3–0.9)
Total Bilirubin: 0.8 mg/dL (ref 0.3–1.2)
Total Protein: 6.5 g/dL (ref 6.5–8.1)

## 2019-07-23 LAB — T4, FREE: Free T4: 0.73 ng/dL (ref 0.61–1.12)

## 2019-07-23 LAB — MAGNESIUM: Magnesium: 2.8 mg/dL — ABNORMAL HIGH (ref 1.7–2.4)

## 2019-07-23 LAB — TSH: TSH: 3.554 u[IU]/mL (ref 0.350–4.500)

## 2019-07-23 LAB — BRAIN NATRIURETIC PEPTIDE: B Natriuretic Peptide: 114 pg/mL — ABNORMAL HIGH (ref 0.0–100.0)

## 2019-07-23 MED ORDER — SODIUM CHLORIDE 0.45 % IV SOLN: INTRAVENOUS | Status: AC

## 2019-07-23 MED ORDER — HEPARIN SODIUM (PORCINE) 5000 UNIT/ML IJ SOLN
5000.0000 [IU] | Freq: Three times a day (TID) | INTRAMUSCULAR | Status: DC
  Administered 2019-07-24 – 2019-07-25 (×6): 5000 [IU] via SUBCUTANEOUS
  Filled 2019-07-23 (×8): qty 1

## 2019-07-23 MED ORDER — SENNOSIDES-DOCUSATE SODIUM 8.6-50 MG PO TABS
1.0000 | ORAL_TABLET | Freq: Every evening | ORAL | Status: DC | PRN
  Filled 2019-07-23: qty 1

## 2019-07-23 MED ORDER — ACETAMINOPHEN 650 MG RE SUPP: 650.0000 mg | Freq: Four times a day (QID) | RECTAL | Status: DC | PRN

## 2019-07-23 MED ORDER — ACETAMINOPHEN 325 MG PO TABS: 650.0000 mg | ORAL_TABLET | Freq: Four times a day (QID) | ORAL | Status: DC | PRN

## 2019-07-23 MED ORDER — DEXTROSE 5 % IV SOLN: INTRAVENOUS | Status: DC

## 2019-07-23 NOTE — ED Provider Notes (Signed)
[facility] Emergency Department Provider Note  ____________________________________________   First MD Initiated Contact with Patient 07/23/19 2145     (approximate)  I have reviewed the triage vital signs and the nursing notes.   HISTORY  Chief Complaint Abnormal Lab    HPI Kenneth Knight is a 84 y.o. male patient with dementia, recent treatment for pneumonia and UTI who comes in with low sodium.  Patient coming from [facility].  Patient unable to give a history due to altered mental status.    Past Medical History:  Diagnosis Date  . Alzheimer's dementia ([place])   . Anemia   . Cancer ([place])   . Falls   . Hyperlipidemia   . Hypertension   . Major depressive disorder     Patient Active Problem List   Diagnosis Date Noted  . Major depression in remission ([place]) 05/29/2019  . Balance problems 05/29/2019  . Nerve damage 05/29/2019  . Prostate cancer ([place]) 05/29/2019  . Peripheral vascular disease ([place]) 09/20/2018  . Resides in skilled nursing facility 04/27/2018  . Dystrophia unguium 03/09/2018  . Hypertension 01/30/2018  . Atherosclerosis of native arteries of the extremities with ulceration ([place]) 01/30/2018  . Healthcare maintenance 03/10/2017  . Falls 12/29/2016  . Anemia 12/29/2016  . Osteoarthritis 12/29/2016  . Dementia with behavioral disturbance ([place]) 12/29/2016  . Hyperlipidemia 12/29/2016  . Hypertensive disorder 12/29/2016  . Chronic diastolic CHF (congestive heart failure) ([place]) 11/07/2016  . HTN, goal below 150/90 11/07/2016  . Absolute anemia 12/09/2015  . Arthritis 12/09/2015  . Imbalance 12/09/2015  . Dementia ([place]) 12/09/2015  . Clinical depression 12/09/2015  . Injury of nerve 12/09/2015  . HLD (hyperlipidemia) 12/09/2015  . Malignant neoplasm of prostate ([place]) 12/09/2015  . Cellulitis of left lower extremity 07/12/2014    Past Surgical History:  Procedure Laterality Date  . HIP SURGERY     right hip  replacement  . LOWER EXTREMITY ANGIOGRAPHY Right 02/01/2018   Procedure: LOWER EXTREMITY ANGIOGRAPHY;  Surgeon: Algernon Huxley, MD;  Location: [place] [facility];  Service: Cardiovascular;  Laterality: Right;    Prior to Admission medications   Medication Sig Start Date End Date Taking? Authorizing Provider  acetaminophen (TYLENOL) 500 MG tablet Take 500 mg by mouth every 6 (six) hours as needed.    [provider]  aspirin 81 MG chewable tablet Chew 81 mg by mouth daily.    [provider]  atorvastatin (LIPITOR) 10 MG tablet Take 1 tablet (10 mg total) by mouth daily. 02/01/18 05/29/19  Algernon Huxley, MD  Cholecalciferol (VITAMIN D PO) Take by mouth.    [provider]  citalopram (CELEXA) 20 MG tablet  01/31/19   [provider]  donepezil (ARICEPT) 10 MG tablet  12/08/15   [provider]  Melatonin 3 MG TABS Take by mouth at bedtime.    [provider]  memantine (NAMENDA) 10 MG tablet  12/01/15   [provider]  VITAMIN E PO Take by mouth.    [provider]    Allergies Patient has no known allergies.  Family History  Problem Relation Age of Onset  . Diabetes Sister   . Heart attack Brother   . Heart disease Brother   . Hypertension Brother     Social History Social History   Tobacco Use  . Smoking status: Never Smoker  . Smokeless tobacco: Never Used  Substance Use Topics  . Alcohol use: No  . Drug use: No  Review of Systems Unable to get review of systems due to altered mental status. ____________________________________________   PHYSICAL EXAM:  VITAL SIGNS: Blood pressure (!) 133/102, pulse 87, temperature 98.3 F (36.8 C), temperature source Axillary, resp. rate 17, height 5\' 10"  (1.778 m), weight 79.4 kg, SpO2 95 %.   Constitutional: Altered, not really responsive Eyes: Conjunctivae are normal.  No trauma noted Head: Atraumatic. Nose: No congestion/rhinnorhea. Mouth/Throat:  Mucous membranes are moist.   Neck: No stridor. Trachea Midline. FROM Cardiovascular: Normal rate, regular rhythm. Grossly normal heart sounds.  Good peripheral circulation. Respiratory: Normal respiratory effort.  No retractions. Lungs CTAB. Gastrointestinal: Soft and nontender. No distention. No abdominal bruits.  Musculoskeletal: No lower extremity tenderness nor edema.  No joint effusions. Neurologic:  Normal speech and language. No gross focal neurologic deficits are appreciated.  Skin:  Skin is warm, dry and intact. No rash noted. Blue dislocation on his right toes. Foot warm.  Psychiatric: Mood and affect are normal. Speech and behavior are normal. GU: Deferred   ____________________________________________   LABS (all labs ordered are listed, but only abnormal results are displayed)  Labs Reviewed  CBC WITH DIFFERENTIAL/PLATELET - Abnormal; Notable for the following components:      Result Value   WBC 11.3 (*)    HCT 53.3 (*)    MCV 104.3 (*)    nRBC 0.3 (*)    Neutro Abs 9.3 (*)    All other components within normal limits  BASIC METABOLIC PANEL - Abnormal; Notable for the following components:   Sodium 168 (*)    Chloride >130 (*)    Glucose, Bld 182 (*)    BUN 56 (*)    Creatinine, Ser 1.58 (*)    GFR calc non Af Amer 39 (*)    GFR calc Af Amer 46 (*)    All other components within normal limits  HEPATIC FUNCTION PANEL - Abnormal; Notable for the following components:   Albumin 2.9 (*)    AST 48 (*)    ALT 64 (*)    Alkaline Phosphatase 226 (*)    All other components within normal limits  MAGNESIUM - Abnormal; Notable for the following components:   Magnesium 2.8 (*)    All other components within normal limits  URINALYSIS, ROUTINE W REFLEX MICROSCOPIC - Abnormal; Notable for the following components:   Color, Urine YELLOW (*)    APPearance CLOUDY (*)    Hgb urine dipstick MODERATE (*)    Nitrite POSITIVE (*)    Leukocytes,Ua LARGE (*)    WBC, UA >50 (*)      Bacteria, UA MANY (*)    All other components within normal limits  BRAIN NATRIURETIC PEPTIDE - Abnormal; Notable for the following components:   B Natriuretic Peptide 114.0 (*)    All other components within normal limits  RESPIRATORY PANEL BY RT PCR (FLU A&B, COVID)  URINE CULTURE  SODIUM, URINE, RANDOM  TSH  T4, FREE  OSMOLALITY, URINE  OSMOLALITY  CBC  COMPREHENSIVE METABOLIC PANEL  CBC   ____________________________________________   RADIOLOGY Robert Bellow, personally viewed and evaluated these images (plain radiographs) as part of my medical decision making, as well as reviewing the written report by the radiologist.  ED MD interpretation: No edema noted  Official radiology report(s): DG Chest 1 View  Result Date: 07/23/2019 CLINICAL DATA:  Shortness of breath EXAM: CHEST  1 VIEW COMPARISON:  Radiograph 06/19/2014 FINDINGS: Chronically low lung volumes with elevation of the right hemidiaphragm similar  to priors. Some streaky basilar atelectatic changes are noted. No consolidative opacity, pneumothorax, effusion, or convincing features of edema. Mild cardiomegaly is more pronounced than on prior portable radiography. The aorta is calcified. The remaining cardiomediastinal contours are unremarkable. No acute osseous or soft tissue abnormality. Degenerative changes are present in the imaged spine and shoulders. Diffuse gaseous distention of the stomach and likely interposition of the colon anterior to the liver with distended air-filled loops of bowel in the right upper quadrant. IMPRESSION: 1. Chronically low lung volumes with elevation of the right hemidiaphragm and streaky bibasilar atelectasis. 2. Mild cardiomegaly without edema or pleural effusion. 3. Aortic atherosclerosis. Electronically Signed   By: Lovena Le M.D.   On: 07/23/2019 23:10    ____________________________________________   PROCEDURES  Procedure(s) performed (including Critical  Care):  Procedures   ____________________________________________   INITIAL IMPRESSION / ASSESSMENT AND PLAN / ED COURSE  ISAYA CARIAS was evaluated in Emergency Department on 07/23/2019 for the symptoms described in the history of present illness. He was evaluated in the context of the global COVID-19 pandemic, which necessitated consideration that the patient might be at risk for infection with the SARS-CoV-2 virus that causes COVID-19. Institutional protocols and algorithms that pertain to the evaluation of patients at risk for COVID-19 are in a state of rapid change based on information released by regulatory bodies including the CDC and federal and state organizations. These policies and algorithms were followed during the patient's care in the ED.    Patient comes in with concern for abnormal labs.  Will get labs to evaluate for hyponatremia, AKI, other electrolyte abnormalities.  Will get urine to evaluate for UTI.  Will discuss with the daughter about goals of care given patient does have baseline dementia.Discolored toes but warm foot. Discussed with hospital team and nurse working on getting doppler and finding out facility if it is acute or chronic.    D/w pt daughters and they would want fluids and antibiotics.  However patient is DNR.  I did update this in his chart  Sodium is significant elevated at 168. Will give 500 CC 1/2 NS and repeat BMP.   UA +uti will start ceftriaxone.   Admit to hospital.       ____________________________________________   FINAL CLINICAL IMPRESSION(S) / ED DIAGNOSES   Final diagnoses:  Hypernatremia  Acute cystitis without hematuria  Alzheimer's dementia without behavioral disturbance, unspecified timing of dementia onset ([place])      MEDICATIONS GIVEN DURING THIS VISIT:  Medications  0.45 % sodium chloride infusion ( Intravenous New Bag/Given 07/23/19 2336)  heparin injection 5,000 Units (has no administration in time range)   dextrose 5 % solution (has no administration in time range)  acetaminophen (TYLENOL) tablet 650 mg (has no administration in time range)    Or  acetaminophen (TYLENOL) suppository 650 mg (has no administration in time range)  senna-docusate (Senokot-S) tablet 1 tablet (has no administration in time range)     ED Discharge Orders    None       Note:  This document was prepared using Dragon voice recognition software and may include unintentional dictation errors.   Vanessa [place], MD 07/24/19 [phone]

## 2019-07-23 NOTE — ED Triage Notes (Signed)
Patient arrived by [facility] from [facility]. Per EMS was called due to low sodium. When EMS arrived patient had IV in right forearm with D5. Patient has been being treated for pneumonia and UTI.  Patient is contracted and non verbal with late stage dementia.

## 2019-07-24 ENCOUNTER — Inpatient Hospital Stay: Payer: Medicare HMO

## 2019-07-24 ENCOUNTER — Other Ambulatory Visit (INDEPENDENT_AMBULATORY_CARE_PROVIDER_SITE_OTHER): Payer: Self-pay | Admitting: Vascular Surgery

## 2019-07-24 DIAGNOSIS — N3000 Acute cystitis without hematuria: Secondary | ICD-10-CM

## 2019-07-24 DIAGNOSIS — N390 Urinary tract infection, site not specified: Secondary | ICD-10-CM | POA: Diagnosis present

## 2019-07-24 DIAGNOSIS — E870 Hyperosmolality and hypernatremia: Principal | ICD-10-CM

## 2019-07-24 DIAGNOSIS — G309 Alzheimer's disease, unspecified: Secondary | ICD-10-CM

## 2019-07-24 DIAGNOSIS — G9341 Metabolic encephalopathy: Secondary | ICD-10-CM | POA: Diagnosis present

## 2019-07-24 DIAGNOSIS — F0280 Dementia in other diseases classified elsewhere without behavioral disturbance: Secondary | ICD-10-CM

## 2019-07-24 DIAGNOSIS — I998 Other disorder of circulatory system: Secondary | ICD-10-CM

## 2019-07-24 LAB — CBC
HCT: 52.2 % — ABNORMAL HIGH (ref 39.0–52.0)
HCT: 47.5 % (ref 39.0–52.0)
Hemoglobin: 15.8 g/dL (ref 13.0–17.0)
Hemoglobin: 14.3 g/dL (ref 13.0–17.0)
MCH: 31.9 pg (ref 26.0–34.0)
MCH: 32.1 pg (ref 26.0–34.0)
MCHC: 30.3 g/dL (ref 30.0–36.0)
MCHC: 30.1 g/dL (ref 30.0–36.0)
MCV: 105.2 fL — ABNORMAL HIGH (ref 80.0–100.0)
MCV: 106.5 fL — ABNORMAL HIGH (ref 80.0–100.0)
Platelets: 148 10*3/uL — ABNORMAL LOW (ref 150–400)
Platelets: 127 10*3/uL — ABNORMAL LOW (ref 150–400)
RBC: 4.96 MIL/uL (ref 4.22–5.81)
RBC: 4.46 MIL/uL (ref 4.22–5.81)
RDW: 15 % (ref 11.5–15.5)
RDW: 14.6 % (ref 11.5–15.5)
WBC: 11 10*3/uL — ABNORMAL HIGH (ref 4.0–10.5)
WBC: 10.5 10*3/uL (ref 4.0–10.5)
nRBC: 0.2 % (ref 0.0–0.2)
nRBC: 0.2 % (ref 0.0–0.2)

## 2019-07-24 LAB — COMPREHENSIVE METABOLIC PANEL
ALT: 56 U/L — ABNORMAL HIGH (ref 0–44)
ALT: 50 U/L — ABNORMAL HIGH (ref 0–44)
AST: 42 U/L — ABNORMAL HIGH (ref 15–41)
AST: 43 U/L — ABNORMAL HIGH (ref 15–41)
Albumin: 2.9 g/dL — ABNORMAL LOW (ref 3.5–5.0)
Albumin: 2.7 g/dL — ABNORMAL LOW (ref 3.5–5.0)
Alkaline Phosphatase: 228 U/L — ABNORMAL HIGH (ref 38–126)
Alkaline Phosphatase: 204 U/L — ABNORMAL HIGH (ref 38–126)
Anion gap: 10 (ref 5–15)
BUN: 53 mg/dL — ABNORMAL HIGH (ref 8–23)
BUN: 46 mg/dL — ABNORMAL HIGH (ref 8–23)
CO2: 30 mmol/L (ref 22–32)
CO2: 23 mmol/L (ref 22–32)
Calcium: 8.8 mg/dL — ABNORMAL LOW (ref 8.9–10.3)
Calcium: 8.5 mg/dL — ABNORMAL LOW (ref 8.9–10.3)
Chloride: >130 mmol/L (ref 98–111)
Chloride: 129 mmol/L — ABNORMAL HIGH (ref 98–111)
Creatinine, Ser: 1.47 mg/dL — ABNORMAL HIGH (ref 0.61–1.24)
Creatinine, Ser: 1.4 mg/dL — ABNORMAL HIGH (ref 0.61–1.24)
GFR calc Af Amer: 50 mL/min — ABNORMAL LOW (ref >60)
GFR calc Af Amer: 53 mL/min — ABNORMAL LOW (ref >60)
GFR calc non Af Amer: 43 mL/min — ABNORMAL LOW (ref >60)
GFR calc non Af Amer: 45 mL/min — ABNORMAL LOW (ref >60)
Glucose, Bld: 142 mg/dL — ABNORMAL HIGH (ref 70–99)
Glucose, Bld: 213 mg/dL — ABNORMAL HIGH (ref 70–99)
Potassium: 3.7 mmol/L (ref 3.5–5.1)
Potassium: 3.6 mmol/L (ref 3.5–5.1)
Sodium: 168 mmol/L (ref 135–145)
Sodium: 162 mmol/L (ref 135–145)
Total Bilirubin: 1 mg/dL (ref 0.3–1.2)
Total Bilirubin: 0.9 mg/dL (ref 0.3–1.2)
Total Protein: 6.2 g/dL — ABNORMAL LOW (ref 6.5–8.1)
Total Protein: 5.8 g/dL — ABNORMAL LOW (ref 6.5–8.1)

## 2019-07-24 LAB — URINALYSIS, ROUTINE W REFLEX MICROSCOPIC
Bilirubin Urine: NEGATIVE
Glucose, UA: NEGATIVE mg/dL
Ketones, ur: NEGATIVE mg/dL
Nitrite: POSITIVE — AB
Protein, ur: NEGATIVE mg/dL
Specific Gravity, Urine: 1.02 (ref 1.005–1.030)
WBC, UA: >50 WBC/hpf — ABNORMAL HIGH (ref 0–5)
pH: 5 (ref 5.0–8.0)

## 2019-07-24 LAB — BLOOD GAS, ARTERIAL
Acid-Base Excess: 2.6 mmol/L — ABNORMAL HIGH (ref 0.0–2.0)
Bicarbonate: 27.2 mmol/L (ref 20.0–28.0)
FIO2: 0.28
O2 Saturation: 96.8 %
Patient temperature: 37
pCO2 arterial: 41 mmHg (ref 32.0–48.0)
pH, Arterial: 7.43 (ref 7.350–7.450)
pO2, Arterial: 86 mmHg (ref 83.0–108.0)

## 2019-07-24 LAB — LACTIC ACID, PLASMA
Lactic Acid, Venous: 1.8 mmol/L (ref 0.5–1.9)
Lactic Acid, Venous: 2.2 mmol/L (ref 0.5–1.9)

## 2019-07-24 LAB — SODIUM, URINE, RANDOM: Sodium, Ur: 36 mmol/L

## 2019-07-24 LAB — OSMOLALITY, URINE: Osmolality, Ur: 729 mOsm/kg (ref 300–900)

## 2019-07-24 LAB — TSH: TSH: 3.116 u[IU]/mL (ref 0.350–4.500)

## 2019-07-24 LAB — RESPIRATORY PANEL BY RT PCR (FLU A&B, COVID)
Influenza A by PCR: NEGATIVE
Influenza B by PCR: NEGATIVE
SARS Coronavirus 2 by RT PCR: NEGATIVE

## 2019-07-24 LAB — MRSA PCR SCREENING: MRSA by PCR: NEGATIVE

## 2019-07-24 LAB — OSMOLALITY: Osmolality: 378 mOsm/kg (ref 275–295)

## 2019-07-24 LAB — GLUCOSE, CAPILLARY
Glucose-Capillary: 163 mg/dL — ABNORMAL HIGH (ref 70–99)
Glucose-Capillary: 60 mg/dL — ABNORMAL LOW (ref 70–99)
Glucose-Capillary: 155 mg/dL — ABNORMAL HIGH (ref 70–99)
Glucose-Capillary: 110 mg/dL — ABNORMAL HIGH (ref 70–99)

## 2019-07-24 LAB — CORTISOL: Cortisol, Plasma: 23.4 ug/dL

## 2019-07-24 LAB — AMMONIA: Ammonia: 16 umol/L (ref 9–35)

## 2019-07-24 MED ORDER — CHLORHEXIDINE GLUCONATE 0.12 % MT SOLN
15.0000 mL | Freq: Two times a day (BID) | OROMUCOSAL | Status: DC
  Administered 2019-07-25: 10:00:00 15 mL via OROMUCOSAL

## 2019-07-24 MED ORDER — ORAL CARE MOUTH RINSE
15.0000 mL | Freq: Two times a day (BID) | OROMUCOSAL | Status: DC
  Administered 2019-07-25 (×2): 15 mL via OROMUCOSAL

## 2019-07-24 MED ORDER — SODIUM CHLORIDE 0.9 % IV SOLN
1.0000 g | INTRAVENOUS | Status: DC
  Administered 2019-07-24 – 2019-07-25 (×2): 1 g via INTRAVENOUS
  Filled 2019-07-24 (×4): qty 10

## 2019-07-24 MED ORDER — BLISTEX MEDICATED EX OINT
TOPICAL_OINTMENT | CUTANEOUS | Status: DC | PRN
  Filled 2019-07-24: qty 7

## 2019-07-24 MED ORDER — SODIUM CHLORIDE 0.9 % IV SOLN: 1.0000 g | Freq: Once | INTRAVENOUS | Status: DC

## 2019-07-24 MED ORDER — CHLORHEXIDINE GLUCONATE CLOTH 2 % EX PADS
6.0000 | MEDICATED_PAD | Freq: Every day | CUTANEOUS | Status: DC
  Administered 2019-07-24 – 2019-07-25 (×2): 6 via TOPICAL

## 2019-07-24 MED ORDER — DEXTROSE-NACL 5-0.45 % IV SOLN: INTRAVENOUS | Status: DC

## 2019-07-24 MED ORDER — DEXTROSE 50 % IV SOLN
INTRAVENOUS | Status: AC
  Administered 2019-07-24: 17:00:00 50 mL via INTRAVENOUS
  Filled 2019-07-24: qty 50

## 2019-07-24 MED ORDER — DEXTROSE 50 % IV SOLN: 1.0000 | Freq: Once | INTRAVENOUS | Status: AC

## 2019-07-24 NOTE — Evaluation (Signed)
Clinical/Bedside Swallow Evaluation Patient Details  Name: Kenneth Knight MRN: [id number] Date of Birth: [birth year]  Today's Date: 07/24/2019 Time: SLP Start Time (ACUTE ONLY): 14 SLP Stop Time (ACUTE ONLY): 1324 SLP Time Calculation (min) (ACUTE ONLY): 54 min  Past Medical History:  Past Medical History:  Diagnosis Date  . Alzheimer's dementia ([place])   . Anemia   . Cancer ([place])   . Falls   . Hyperlipidemia   . Hypertension   . Major depressive disorder    Past Surgical History:  Past Surgical History:  Procedure Laterality Date  . HIP SURGERY     right hip replacement  . LOWER EXTREMITY ANGIOGRAPHY Right 02/01/2018   Procedure: LOWER EXTREMITY ANGIOGRAPHY;  Surgeon: Algernon Huxley, MD;  Location: [place] [facility];  Service: Cardiovascular;  Laterality: Right;   HPI:  Per admitting History and Physical.......    HPI: Kenneth Knight is a 85 y.o. male, total care dependent with advanced dementia with a history of peripheral vascular disease and hypertension who was brought to the emergency room because of abnormal mental status.  He was currently being treated for a UTI at the nursing home.  He was very lethargic and unable to participate in the history.  As of the history is taken from the ER records.  On arrival in the emergency room he was afebrile with a temperature of 98.3 pulse 87, respirations 17 and blood pressure of 130/102 with normal O2 sat on room air of 95.  In his work-up he had a white cell count of 11,000.  Also significant on his blood work with a serum sodium of 168 with chloride of 130 and a creatinine of 1.58 above his baseline of 0.66 a few months prior.  He was also noted to have mild elevation in his transaminases.  BNP was slightly elevated at 114.  His urinalysis was strongly consistent with UTI.  Chest x-ray showed no acute findings.  Influenza and Covid test were both negative.  Patient was started on IV antibiotics and IV hydration and hospitalist consult  requested admission.  CODE STATUS was discussed with daughter who elected to place patient DNR.   Assessment / Plan / Recommendation: 07/24/19 Clinical Impression  Bedside swallow eval today revealed inability to tolerate PO's safely secondary to lethargy. Pt opened eyes to voice and repositioning. Pt noted to be contracted making upright positioning difficult even with the use of extra pillows to support head and left side of body. Noted dried secretions on lips and tongue. Oral care provided by ST. Once awake and repositioned, Pt was given a small piece of ice in which he did open his mouth for and produce some lingual movement. No anterior leakage noted. Given tactile and verbal stimulation, Pt did NOT produce a swallow. No further boluses attempted secondary to lethargy with risk of aspiration. ST will follow Pt and reassess through treatment as alertness improves in hopes of starting a modified diet. Recommend continue NPO at this time. Prognosis fair. SLP Visit Diagnosis: Dysphagia, oropharyngeal phase (R13.12)    Aspiration Risk  Severe aspiration risk    Diet Recommendation NPO     Reassess in hopes of improved alertness for diet.   Other  Recommendations Oral Care Recommendations: Oral care BID   Follow up Recommendations Skilled Nursing facility      Frequency and Duration min 3x week  1 week       Prognosis Prognosis for Safe Diet Advancement: Guarded Barriers to Reach Goals: Cognitive  deficits      Swallow Study   General Date of Onset: 07/23/19 Type of Study: Bedside Swallow Evaluation Diet Prior to this Study: NPO Temperature Spikes Noted: No Respiratory Status: Room air History of Recent Intubation: No Behavior/Cognition: Lethargic/Drowsy;Doesn't follow directions Oral Cavity Assessment: Dried secretions Oral Care Completed by SLP: Yes Oral Cavity - Dentition: Poor condition Self-Feeding Abilities: Total assist Patient Positioning: Postural control interferes  with function;Other (comment)(Contracted. Difficult to position) Volitional Swallow: Unable to elicit    Oral/Motor/Sensory Function Overall Oral Motor/Sensory Function: Moderate impairment   Ice Chips Ice chips: Impaired Presentation: Spoon Oral Phase Impairments: Poor awareness of bolus Oral Phase Functional Implications: Prolonged oral transit   Thin Liquid Thin Liquid: Not tested    Nectar Thick Nectar Thick Liquid: Not tested   Honey Thick Honey Thick Liquid: Not tested   Puree Puree: Not tested   Solid     Solid: Not tested      Kenneth Knight 07/24/2019,1:27 PM

## 2019-07-24 NOTE — ED Notes (Addendum)
In and Out cath completed at 2330

## 2019-07-24 NOTE — ED Notes (Addendum)
Date and time results received: 07/24/19 6:12 AM    Test: Lactic  Critical Value: 2.2

## 2019-07-24 NOTE — Consult Note (Signed)
[name] Consult Note  MRN : [id number]  Kenneth Knight is a 85 y.o. ([birth year]) male who presents with chief complaint of  Chief Complaint  Patient presents with  . Abnormal Lab   History of Present Illness:  The patient is an 85 year old male with a past medical history of advanced dementia, hyperlipidemia, hypertension, chronic diagnosis of congestive heart failure, peripheral vascular disease who presented to the [facility] emergency department from [facility] admitted with UTI.  Of note: Patient with advanced dementia.  Information for this consult obtained from bedside nurse and previous epic notation.  On physical examination, the patient was noted to have "bluish discoloration" to the distal right foot.  Unable to palpate pedal pulses and therefore vascular surgery was consulted by Dr. Damita Dunnings for further recommendations.   Current Facility-Administered Medications  Medication Dose Route Frequency Provider Last Rate Last Admin  . acetaminophen (TYLENOL) tablet 650 mg  650 mg Oral Q6H PRN Athena Masse, MD       Or  . acetaminophen (TYLENOL) suppository 650 mg  650 mg Rectal Q6H PRN Athena Masse, MD      . cefTRIAXone (ROCEPHIN) 1 g in sodium chloride 0.9 % 100 mL IVPB  1 g Intravenous Q24H Athena Masse, MD   Stopped at 07/24/19 [phone]  . dextrose 5 % solution   Intravenous Continuous Athena Masse, MD 100 mL/hr at 07/24/19 0228 New Bag at 07/24/19 0228  . heparin injection 5,000 Units  5,000 Units Subcutaneous Q8H Athena Masse, MD   5,000 Units at 07/24/19 0919  . senna-docusate (Senokot-S) tablet 1 tablet  1 tablet Oral QHS PRN Athena Masse, MD       Current Outpatient Medications  Medication Sig Dispense Refill  . acetaminophen (TYLENOL) 500 MG tablet Take 500 mg by mouth every 4 (four) hours as needed for mild pain or moderate pain.     Marland Kitchen aspirin 81 MG chewable tablet Chew 81 mg by mouth daily.     Marland Kitchen atorvastatin (LIPITOR) 10 MG tablet Take 1 tablet (10 mg total) by mouth daily. 30 tablet 11  . Cholecalciferol 25 MCG (1000 UT) capsule Take 2,000 Units by mouth daily.    . citalopram (CELEXA) 20 MG tablet Take 20 mg by mouth daily.     Marland Kitchen donepezil (ARICEPT) 10 MG tablet Take 10 mg by mouth daily.     . Melatonin 3 MG TABS Take by mouth at bedtime.    . memantine (NAMENDA) 10 MG tablet Take 10 mg by mouth 2 (two) times daily.     . polycarbophil (FIBER-LAX) 625 MG tablet Take 1,250 mg by mouth daily.    . vitamin E 400 UNIT capsule Take 400 Units by mouth daily.      Past Medical History:  Diagnosis Date  . Alzheimer's dementia ([place])   . Anemia   . Cancer ([place])   . Falls   . Hyperlipidemia   . Hypertension   . Major depressive disorder    Past Surgical History:  Procedure Laterality Date  . HIP SURGERY     right hip replacement  . LOWER EXTREMITY ANGIOGRAPHY Right 02/01/2018   Procedure: LOWER EXTREMITY ANGIOGRAPHY;  Surgeon: Algernon Huxley, MD;  Location: [facility];  Service: Cardiovascular;  Laterality: Right;   Social History Social History   Tobacco Use  . Smoking status: Never Smoker  . Smokeless tobacco: Never Used  Substance Use Topics  .  Alcohol use: No  . Drug use: No   Family History Family History  Problem Relation Age of Onset  . Diabetes Sister   . Heart attack Brother   . Heart disease Brother   . Hypertension Brother   Denies family history of peripheral artery disease, venous disease or renal disease.  No Known Allergies  REVIEW OF SYSTEMS (Negative unless checked)  Positive for advanced dementia  Constitutional: [] Weight loss  [] Fever  [] Chills Cardiac: [] Chest pain   [] Chest pressure   [] Palpitations   [] Shortness of breath when laying flat   [] Shortness of breath at rest   [] Shortness of breath with exertion. Vascular:  [] Pain in legs with walking   [] Pain in legs at rest   [] Pain in legs when laying flat   [] Claudication   [] Pain  in feet when walking  [] Pain in feet at rest  [] Pain in feet when laying flat   [] History of DVT   [] Phlebitis   [x] Swelling in legs   [] Varicose veins   [] Non-healing ulcers Pulmonary:   [] Uses home oxygen   [] Productive cough   [] Hemoptysis   [] Wheeze  [] COPD   [] Asthma Neurologic:  [] Dizziness  [] Blackouts   [] Seizures   [] History of stroke   [] History of TIA  [] Aphasia   [] Temporary blindness   [] Dysphagia   [] Weakness or numbness in arms   [] Weakness or numbness in legs Musculoskeletal:  [] Arthritis   [] Joint swelling   [] Joint pain   [] Low back pain Hematologic:  [] Easy bruising  [] Easy bleeding   [] Hypercoagulable state   [] Anemic  [] Hepatitis Gastrointestinal:  [] Blood in stool   [] Vomiting blood  [] Gastroesophageal reflux/heartburn   [] Difficulty swallowing. Genitourinary:  [] Chronic kidney disease   [] Difficult urination  [] Frequent urination  [] Burning with urination   [] Blood in urine Skin:  [] Rashes   [] Ulcers   [] Wounds Psychological:  [] History of anxiety   [x]  History of major depression.  Physical Examination  Vitals:   07/24/19 0100 07/24/19 0501 07/24/19 0726 07/24/19 1238  BP: 117/70 122/83 126/63 127/83  Pulse:  83 78 81  Resp: 19 16 15 19   Temp:      TempSrc:      SpO2:  100% 100% 97%  Weight:      Height:       Body mass index is 25.11 kg/m. Gen:  With advanced dementia, NAD Head: [place]/AT, No temporalis wasting. Prominent temp pulse not noted. Ear/Nose/Throat: Hearing grossly intact, nares w/o erythema or drainage, oropharynx w/o Erythema/Exudate Eyes: Sclera non-icteric, conjunctiva clear Neck: Trachea midline.  No JVD.  Pulmonary:  Good air movement, respirations not labored, equal bilaterally.  Cardiac: RRR, normal S1, S2. Vascular:  Vessel Right Left  Radial Palpable Palpable  Ulnar Palpable Palpable  Brachial Palpable Palpable  Carotid Palpable, without bruit Palpable, without bruit  Aorta Not palpable N/A  Femoral Palpable Palpable  Popliteal  Palpable Palpable  PT Non-Palpable Non-Palpable  DP Non-Palpable Non-Palpable   Right Lower Extremity: Thigh soft.  Calf soft.  Extremities warm distally to toes.  Foot is erythematous from the ankle to toes.  Seems to be tender to palpation on exam.  Unable to palpate pedal pulses.  Gastrointestinal: soft, non-tender/non-distended. No guarding/reflex.  Musculoskeletal: M/S 5/5 throughout.  Extremities without ischemic changes.  No deformity or atrophy. No edema. Neurologic: Sensation grossly intact in extremities.  Symmetrical.  Speech is fluent. Motor exam as listed above. Psychiatric: Judgment intact, Mood & affect appropriate for pt's clinical situation. Dermatologic: No rashes or ulcers  noted.  No open wounds. Lymph : No Cervical, Axillary, or Inguinal lymphadenopathy.  CBC Lab Results  Component Value Date   WBC 11.0 (H) 07/24/2019   HGB 15.8 07/24/2019   HCT 52.2 (H) 07/24/2019   MCV 105.2 (H) 07/24/2019   PLT 148 (L) 07/24/2019   BMET    Component Value Date/Time   NA 168 (HH) 07/24/2019 0201   NA 142 06/21/2014 0425   K 3.7 07/24/2019 0201   K 3.9 06/21/2014 0425   CL >130 (HH) 07/24/2019 0201   CL 110 (H) 06/21/2014 0425   CO2 30 07/24/2019 0201   CO2 25 06/21/2014 0425   GLUCOSE 142 (H) 07/24/2019 0201   GLUCOSE 77 06/21/2014 0425   BUN 53 (H) 07/24/2019 0201   BUN 19 (H) 06/21/2014 0425   CREATININE 1.47 (H) 07/24/2019 0201   CREATININE 0.98 06/21/2014 0425   CALCIUM 8.8 (L) 07/24/2019 0201   CALCIUM 7.7 (L) 06/21/2014 0425   GFRNONAA 43 (L) 07/24/2019 0201   GFRNONAA >60 06/21/2014 0425   GFRNONAA >60 06/27/2013 1858   GFRAA 50 (L) 07/24/2019 0201   GFRAA >60 06/21/2014 0425   GFRAA >60 06/27/2013 1858   Estimated Creatinine Clearance: 37.9 mL/min (A) (by C-G formula based on SCr of 1.47 mg/dL (H)).  COAG Lab Results  Component Value Date   INR 1.1 11/02/2018   INR 1.0 11/15/2011   Radiology DG Chest 1 View  Result Date: 07/23/2019 CLINICAL  DATA:  Shortness of breath EXAM: CHEST  1 VIEW COMPARISON:  Radiograph 06/19/2014 FINDINGS: Chronically low lung volumes with elevation of the right hemidiaphragm similar to priors. Some streaky basilar atelectatic changes are noted. No consolidative opacity, pneumothorax, effusion, or convincing features of edema. Mild cardiomegaly is more pronounced than on prior portable radiography. The aorta is calcified. The remaining cardiomediastinal contours are unremarkable. No acute osseous or soft tissue abnormality. Degenerative changes are present in the imaged spine and shoulders. Diffuse gaseous distention of the stomach and likely interposition of the colon anterior to the liver with distended air-filled loops of bowel in the right upper quadrant. IMPRESSION: 1. Chronically low lung volumes with elevation of the right hemidiaphragm and streaky bibasilar atelectasis. 2. Mild cardiomegaly without edema or pleural effusion. 3. Aortic atherosclerosis. Electronically Signed   By: Lovena Le M.D.   On: 07/23/2019 23:10   US ARTERIAL LOWER EXTREMITY DUPLEX RIGHT(NON-ABI)  Result Date: 07/24/2019 CLINICAL DATA:  85 year old male with a history of lower limb ischemia EXAM: NONINVASIVE PHYSIOLOGIC VASCULAR STUDY OF RIGHT LOWER EXTREMITIES TECHNIQUE: Evaluation of right lower extremities was performed at rest, including directed duplex. COMPARISON:  None. FINDINGS: Right ABI:  Not recorded Right Lower Extremity: Directed duplex of the right lower extremity demonstrates triphasic common femoral artery, profunda femoris, SFA, popliteal artery. Monophasic flow of the anterior tibial artery. Posterior tibial artery is occluded Left Lower Extremity: IMPRESSION: Directed duplex demonstrates atherosclerotic changes of the right lower extremity with patent femoropopliteal system, abnormal flow through the anterior tibial artery, and occlusion of the posterior tibial artery. Signed, Dulcy Fanny. Dellia Nims, RPVI Vascular and  [facility] Radiology Electronically Signed   By: Corrie Mckusick D.O.   On: 07/24/2019 09:31   Assessment/Plan The patient is an 85 year old male with a past medical history of advanced dementia, hyperlipidemia, hypertension, chronic diagnosis of congestive heart failure, peripheral vascular disease who presented to the [facility] emergency department from [facility] admitted with UTI. 1. Atherosclerotic Disease With Cellulitis: Patient with cellulitis  to the right foot. Unable to palpate pedal pulses.  Seems to be tender to palpation on exam.  Arterial ultrasound of the right lower extremity notable for occluded posterior tibial artery and monophasic blood flow through anterior tibial. With active cellulitis. Recommend undergoing a right lower extremity angiogram possible intervention to assess the patient's anatomy and contributing degree of atherosclerotic disease.  If appropriate an attempt to revascularize the leg can be at that time.  Since the patient has advanced dementia and is unable to consent for himself a phone call was placed to his daughter Rodena Piety.  Discussed in detail the patient's physical exam, recent arterial ultrasound and the recommendations.  Procedure, risks and benefits explained to the patient's daughter.  All questions answered. She will discuss with her brother who is also POA and give an answer. Will plan on either Thursday / Friday with Dr. Lucky Cowboy. 2. Advanced Dementia: The patient's daughter and son are his legal POA.  Awaiting their consent for angiogram at this time. 3. Hyperlipidemia: On ASA and statin. Encouraged good control as its slows the progression of atherosclerotic disease  Discussed with Dr. Mayme Genta, PA-C  07/24/2019 12:39 PM   This note was created with Dragon medical transcription system.  Any error is purely unintentional

## 2019-07-24 NOTE — ED Notes (Signed)
Pt resting on stretcher with lights off to enhance rest. No acute distress noted at this time. Provided for comfort and safety. Will continue to assess.

## 2019-07-24 NOTE — ED Notes (Signed)
Phyliss, RN, [facility], called for pt status, updated with plan to admit, she reports she will call the daughter

## 2019-07-24 NOTE — ED Notes (Signed)
Pt daughter updated at this time.

## 2019-07-24 NOTE — ED Notes (Addendum)
Pt resting on stretcher with eyes closed and even respirations. No distress noted. Repositioned and provided for comfort and safety.

## 2019-07-24 NOTE — Progress Notes (Signed)
Received report from [facility] in ED. Patient came up to floor unresponsive. Done multiple sternal rubs. Patients O2 sats were 67% RA placed nonrebreather on patient at 10 LPM. Patients blood sugar was 60, gave amp D50. RR 16. Blood pressure stable. Called rapid response. Spoke with Dr. Manuella Ghazi on the phone and he ordered for patient to go to a step down unit. Transferred patient to step down and gave report to [place].

## 2019-07-24 NOTE — Consult Note (Addendum)
Name: Kenneth Knight MRN: [id number] DOB: Sep 10, 1931    ADMISSION DATE:  07/23/2019 CONSULTATION DATE: 07/24/2019  REFERRING MD : Dr. Manuella Ghazi   CHIEF COMPLAINT: Altered Mental   BRIEF PATIENT DESCRIPTION:  85 yo male with advanced dementia admitted with acute metabolic encephalopathy, acute renal failure, severe hypernatremia/hyperchloremia, UTI, and RLE occluded posterior tibial artery/abnormal flow through anterior tibial   SIGNIFICANT EVENTS/STUDIES:  12/15: Pt initially admitted to the medsurg unit with metabolic encephalopathy, severe hypernatremia/hyperchloremia, UTI, hypoglycemia, and RLE cellulitis 12/16: Unable to palpate RLE pulses vascular surgery consulted arterial ultrasound revealed occluded posterior tibial artery and abnormal flow through anterior tibial.  Recommend RLE angiogram with possible intervention (waiting on family consent if pts family consent vascular will schedule angiogram on 12/17 or 12/18) 12/16: Pt became obtunded and hypoglycemic rapid response initiated pt required transfer to the stepdown unit on NRB PCCM consulted for additional management  MICRO DATA: Urine culture 12/15>>  Influenza PCR 12/16>> negative COVID-19 12/16>>negative MRSA PCR 12/16>>  HISTORY OF PRESENT ILLNESS:   This is an 85 yo male with a PMH of Major Depressive Disorder, HTN, HLD, Falls, Chronic Diastolic CHF, Anemia, and Prostate Cancer.  He presented to [facility] ER on 12/15 from [facility] via EMS with suspected abnormal labs and lethargy.  EMS reported when they arrived at [facility] pt was in the process of receiving iv D5W.  Pt also recently diagnosed with UTI and pneumonia requiring abx therapy.  Pt with hx of late stage dementia, at baseline he is contracted and with very limited verbal ability.  In the ER lab results revealed Na+ 168, chloride >130, glucose 182, BUN 56, creatinine 1.58, alk phos 226, AST 48, ALT 64, BNP 114, wbc 11.3, osmolality 378, urine osmolality 729,  sodium urine 36, and UA positive for UTI. COVID-19 and influenza PCR negative, however CXR concerning for chronic low lung volumes and bibasilar atelectasis. Pt received 500 ml bolus of 1/2NS and ceftriaxone. Pt also noted to have right lower extremity discoloration but warm to touch, therefore arterial US of right lower extremity ordered.  Pt subsequently admitted to the medsurg unit per hospitalist team for additional workup and treatment. Detailed hospital course listed above under significant events.  PAST MEDICAL HISTORY :   has a past medical history of Alzheimer's dementia ([place]), Anemia, Cancer ([place]), Falls, Hyperlipidemia, Hypertension, and Major depressive disorder.  has a past surgical history that includes Hip surgery and Lower Extremity Angiography (Right, 02/01/2018). Prior to Admission medications   Medication Sig Start Date End Date Taking? Authorizing Provider  acetaminophen (TYLENOL) 500 MG tablet Take 500 mg by mouth every 4 (four) hours as needed for mild pain or moderate pain.    Yes [provider]  aspirin 81 MG chewable tablet Chew 81 mg by mouth daily.   Yes [provider]  atorvastatin (LIPITOR) 10 MG tablet Take 1 tablet (10 mg total) by mouth daily. 02/01/18 07/24/19 Yes Dew, Erskine Squibb, MD  Cholecalciferol 25 MCG (1000 UT) capsule Take 2,000 Units by mouth daily.   Yes [provider]  citalopram (CELEXA) 20 MG tablet Take 20 mg by mouth daily.  01/31/19  Yes [provider]  donepezil (ARICEPT) 10 MG tablet Take 10 mg by mouth daily.  12/08/15  Yes [provider]  Melatonin 3 MG TABS Take by mouth at bedtime.   Yes [provider]  memantine (NAMENDA) 10 MG tablet Take 10 mg by mouth 2 (two) times daily.  12/01/15  Yes  [provider]  polycarbophil (FIBER-LAX) 625 MG tablet Take 1,250 mg by mouth daily.   Yes [provider]  vitamin E 400 UNIT capsule Take 400 Units by mouth daily.    Yes [provider]   No Known Allergies  FAMILY HISTORY:  family history includes Diabetes in his sister; Heart attack in his brother; Heart disease in his brother; Hypertension in his brother. SOCIAL HISTORY:  reports that he has never smoked. He has never used smokeless tobacco. He reports that he does not drink alcohol or use drugs.  REVIEW OF SYSTEMS:   Unable to assess pt non verbal   SUBJECTIVE:  Unable to assess pt non verbal   VITAL SIGNS: Temp:  [98.3 F (36.8 C)] 98.3 F (36.8 C) (12/15 2152) Pulse Rate:  [78-92] 80 (12/16 1620) Resp:  [15-30] 30 (12/16 1620) BP: (113-134)/(58-102) 122/89 (12/16 1620) SpO2:  [93 %-100 %] 98 % (12/16 1620) Weight:  [79.4 kg] 79.4 kg (12/15 2153)  PHYSICAL EXAMINATION: General: chronically ill, cachetic elderly male resting in bed, NAD on NRB Neuro: non verbal, not following commands, withdraws from stimulation, PERRL  HEENT: supple, no JVD  Cardiovascular: irregular irregular with BBB, no R/G, 1+ palpable pulse LLE, only able to palpate right femoral/non-palpable right distal pulses  Lungs: diminished with faint rhonchi throughout, even, non labored  Abdomen: +BS x4, soft, non distended  Musculoskeletal: bilateral upper and lower extremities contracted  Skin: RLE scabbed over abrasion on right shin, bilateral boggy heels, RLE erythema from ankles to toes with cyanotic toes although warm to touch  Recent Labs  Lab 07/23/19 2151 07/24/19 0201  NA 168* 168*  K 3.7 3.7  CL >130* >130*  CO2 26 30  BUN 56* 53*  CREATININE 1.58* 1.47*  GLUCOSE 182* 142*   Recent Labs  Lab 07/23/19 2151 07/24/19 0201  HGB 16.4 15.8  HCT 53.3* 52.2*  WBC 11.3* 11.0*  PLT 156 148*   DG Chest 1 View  Result Date: 07/23/2019 CLINICAL DATA:  Shortness of breath EXAM: CHEST  1 VIEW COMPARISON:  Radiograph 06/19/2014 FINDINGS: Chronically low lung volumes with elevation of the right hemidiaphragm similar to priors. Some streaky basilar atelectatic  changes are noted. No consolidative opacity, pneumothorax, effusion, or convincing features of edema. Mild cardiomegaly is more pronounced than on prior portable radiography. The aorta is calcified. The remaining cardiomediastinal contours are unremarkable. No acute osseous or soft tissue abnormality. Degenerative changes are present in the imaged spine and shoulders. Diffuse gaseous distention of the stomach and likely interposition of the colon anterior to the liver with distended air-filled loops of bowel in the right upper quadrant. IMPRESSION: 1. Chronically low lung volumes with elevation of the right hemidiaphragm and streaky bibasilar atelectasis. 2. Mild cardiomegaly without edema or pleural effusion. 3. Aortic atherosclerosis. Electronically Signed   By: Lovena Le M.D.   On: 07/23/2019 23:10   US ARTERIAL LOWER EXTREMITY DUPLEX RIGHT(NON-ABI)  Result Date: 07/24/2019 CLINICAL DATA:  85 year old male with a history of lower limb ischemia EXAM: NONINVASIVE PHYSIOLOGIC VASCULAR STUDY OF RIGHT LOWER EXTREMITIES TECHNIQUE: Evaluation of right lower extremities was performed at rest, including directed duplex. COMPARISON:  None. FINDINGS: Right ABI:  Not recorded Right Lower Extremity: Directed duplex of the right lower extremity demonstrates triphasic common femoral artery, profunda femoris, SFA, popliteal artery. Monophasic flow of the anterior tibial artery. Posterior tibial artery is occluded Left Lower Extremity: IMPRESSION: Directed duplex demonstrates atherosclerotic changes of the right lower extremity with patent femoropopliteal  system, abnormal flow through the anterior tibial artery, and occlusion of the posterior tibial artery. Signed, Dulcy Fanny. Dellia Nims, RPVI [facility]iology [facility] Radiology Electronically Signed   By: Corrie Mckusick D.O.   On: 07/24/2019 09:31    ASSESSMENT / PLAN:  Severe hypernatremia and hyperchloremia secondary to poor po  intake  Acute renal failure due to volume depletion  Trend BMP  Replace electrolytes as indicated  Monitor UOP Avoid nephrotoxic medications  Continue D5W '@100'  ml/hr   Atherosclerotic changes of RLE with arterial US revealing abnormal flow through the anterior tibial artery and occlusion of posterior tibial artery Vascular surgery consulted-recommended RLE angiogram with possible intervention if pts family consents  Leukocytosis secondary to UTI  Trend WBC and monitor fever curve  Will obtain blood cultures, continue to follow urine culture Continue ceftriaxone for now   Elevated liver enzymes of unknown etiology-improving  Trend hepatic panel  Hypoglycemia due to poor po intake  CBG's q2hrs for now  Follow hypoglycemia protocol   Acute metabolic encephalopathy secondary to severe hypernatremia and UTI Advanced dementia (baseline contracted and requires total care) Ammonia level pending  ABG results pending  Avoid sedating medications when possible  Frequent reorientation  NPO until mentation improves  Turn q2hrs   Best Practice: VTE px: subq heparin  SUP px: not indicated  Diet: NPO for now due to mentation, will consult dietitian and speech therapy once mentation is improved   -Per hospitalist attending family aware of overall poor prognosis. Palliative care consulted to assist with goals of treatment.   Marda Stalker, [place] Pager [phone] (please enter 7 digits) PCCM Consult Pager [phone] (please enter 7 digits)

## 2019-07-24 NOTE — Plan of Care (Signed)
Pt will tolerate least restrictive diet considered safe

## 2019-07-24 NOTE — Significant Event (Signed)
Rapid Response Event Note  Overview: Time Called: [id number] Arrival Time: 1700 Event Type: Neurologic, Respiratory  Initial Focused Assessment: called RR on patient on 100%NRB, unresponsive, recent admit from ED   Interventions: spoke with DR Manuella Ghazi and will move to stepdown for closer monitoring  Plan of Care (if not transferred):  Event Summary: Name of Physician Notified: Jeralyn Ruths, MD at 1700  Name of Consulting Physician Notified: Patricia Pesa at 1710(notified by Dr Manuella Ghazi)  Outcome: Transferred (Comment)(to stepdown)  Event End Time: [place]

## 2019-07-24 NOTE — Progress Notes (Signed)
1        [facility] [place] NAME: Kenneth Knight    MR#:  [id number]  DATE OF BIRTH:  10-21-1931  SUBJECTIVE:  CHIEF COMPLAINT:   Chief Complaint  Patient presents with  . Abnormal Lab  Patient lethargic, barely arousable REVIEW OF SYSTEMS:  ROS unable to obtain due to his mental status DRUG ALLERGIES:  No Known Allergies VITALS:  Blood pressure 122/89, pulse 80, temperature 98.3 F (36.8 C), temperature source Axillary, resp. rate (!) 30, height 5\' 10"  (1.778 m), weight 79.4 kg, SpO2 98 %. PHYSICAL EXAMINATION:  Physical Exam Constitutional:      Appearance: He is cachectic. He is ill-appearing and toxic-appearing.  HENT:     Head: Normocephalic and atraumatic.  Eyes:     Conjunctiva/sclera: Conjunctivae normal.     Pupils: Pupils are equal, round, and reactive to light.  Neck:     Thyroid: No thyromegaly.     Trachea: No tracheal deviation.  Cardiovascular:     Rate and Rhythm: Normal rate and regular rhythm.     Heart sounds: Normal heart sounds.  Pulmonary:     Effort: Pulmonary effort is normal. No respiratory distress.     Breath sounds: Normal breath sounds. No wheezing.  Chest:     Chest wall: No tenderness.  Abdominal:     General: Bowel sounds are normal. There is no distension.     Palpations: Abdomen is soft.     Tenderness: There is no abdominal tenderness.  Musculoskeletal:        General: Normal range of motion.     Cervical back: Normal range of motion and neck supple.     Comments: Laying in fetal position  Feet:     Comments: Foot is erythematous from the ankle to toes.  Seems to be tender to palpation on exam.  Unable to palpate pedal pulses  bluish discoloration to the distal right foot Skin:    General: Skin is warm and dry.     Findings: No rash.  Neurological:     Mental Status: He is unresponsive.     Cranial Nerves: No cranial nerve deficit.     Comments: He is lying with contraction of all 4 extremities    Psychiatric:        Behavior: Behavior is uncooperative.     Comments: Unable to evaluate due to his mental status    LABORATORY PANEL:  Male CBC Recent Labs  Lab 07/24/19 0201  WBC 11.0*  HGB 15.8  HCT 52.2*  PLT 148*   ------------------------------------------------------------------------------------------------------------------ Chemistries  Recent Labs  Lab 07/23/19 2151 07/24/19 0201  NA 168* 168*  K 3.7 3.7  CL >130* >130*  CO2 26 30  GLUCOSE 182* 142*  BUN 56* 53*  CREATININE 1.58* 1.47*  CALCIUM 9.2 8.8*  MG 2.8*  --   AST 48* 42*  ALT 64* 56*  ALKPHOS 226* 228*  BILITOT 0.8 1.0   RADIOLOGY:  DG Chest 1 View  Result Date: 07/23/2019 CLINICAL DATA:  Shortness of breath EXAM: CHEST  1 VIEW COMPARISON:  Radiograph 06/19/2014 FINDINGS: Chronically low lung volumes with elevation of the right hemidiaphragm similar to priors. Some streaky basilar atelectatic changes are noted. No consolidative opacity, pneumothorax, effusion, or convincing features of edema. Mild cardiomegaly is more pronounced than on prior portable radiography. The aorta is calcified. The remaining cardiomediastinal contours are unremarkable. No acute osseous or soft tissue abnormality. Degenerative changes are present in the  imaged spine and shoulders. Diffuse gaseous distention of the stomach and likely interposition of the colon anterior to the liver with distended air-filled loops of bowel in the right upper quadrant. IMPRESSION: 1. Chronically low lung volumes with elevation of the right hemidiaphragm and streaky bibasilar atelectasis. 2. Mild cardiomegaly without edema or pleural effusion. 3. Aortic atherosclerosis. Electronically Signed   By: Lovena Le M.D.   On: 07/23/2019 23:10   US ARTERIAL LOWER EXTREMITY DUPLEX RIGHT(NON-ABI)  Result Date: 07/24/2019 CLINICAL DATA:  85 year old male with a history of lower limb ischemia EXAM: NONINVASIVE PHYSIOLOGIC VASCULAR STUDY OF RIGHT LOWER  EXTREMITIES TECHNIQUE: Evaluation of right lower extremities was performed at rest, including directed duplex. COMPARISON:  None. FINDINGS: Right ABI:  Not recorded Right Lower Extremity: Directed duplex of the right lower extremity demonstrates triphasic common femoral artery, profunda femoris, SFA, popliteal artery. Monophasic flow of the anterior tibial artery. Posterior tibial artery is occluded Left Lower Extremity: IMPRESSION: Directed duplex demonstrates atherosclerotic changes of the right lower extremity with patent femoropopliteal system, abnormal flow through the anterior tibial artery, and occlusion of the posterior tibial artery. Signed, Dulcy Fanny. Dellia Nims, RPVI [facility] Radiology Electronically Signed   By: Corrie Mckusick D.O.   On: 07/24/2019 09:31   ASSESSMENT AND PLAN:   Acute metabolic encephalopathy/unresponsiveness --Secondary to UTI dehydration and hyponatremia --Flu and coronavirus test negative -Check labs, ABG, ammonia  Hypoglycemia -On D5, give orange juice  UTI --IV ceftriaxone for now and can change once urine culture results --IV hydration  Acute hypernatremia/hyperchloremia --Secondary to poor oral intake from current illness --IV hydration with D5W.  Monitor serum sodium    AKI (acute kidney injury) --Secondary to dehydration, likely from poor oral intake account of altered mental status --- IV hydration as above and monitor renal function    Abnormal LFTs --- Uncertain etiology  Discoloration/blue distal right foot, with history of peripheral vascular disease - Vascular planning for angiogram Thursday or Friday depending on his clinical condition  Total self-care deficit/functional quadriplegia related to advanced dementia and contracted state, -Increased nursing care.  Has been at the facilities for last at least 3 to 4 years   Transfer him to ICU/stepdown close monitoring -Case discussed with  ICU attending Dr. Mortimer Fries and requested STAT consult   Overall very poor prognosis, I had a long discussion with patient's daughter Kenneth Knight.  She understands and if he does not improve she is willing to make him comfort care  We will consult palliative care  All the records are reviewed and case discussed with Care Management/Social Worker. Management plans discussed with the patient, family (daughter Kenneth Knight) and they are in agreement.  CODE STATUS: DNR  TOTAL TIME (critical care) TAKING CARE OF THIS PATIENT: 45 minutes.   More than 50% of the time was spent in counseling/coordination of care: YES  POSSIBLE D/C IN 2-3 DAYS, DEPENDING ON CLINICAL CONDITION.  And vascular evaluation   Max Sane M.D on 07/24/2019 at 4:57 PM  Between 7am to 6pm - Pager - [phone]  After 6pm go to [email or web address] - password [email or web address]  [facility]   CC: Primary care physician; Kirk Ruths, MD  Note: This dictation was prepared with Dragon dictation along with smaller phrase technology. Any transcriptional errors that result from this process are unintentional.

## 2019-07-24 NOTE — Progress Notes (Signed)
Pt's Na has corrected to 162 @ 1850 from 168 @ 0200 this morning.  Goal rate of Na correction in 24 hrs is approximately 10 mEq.  Will decrease D5W infusion to 85 ml/hr (1.35 ml/kg/hr).  Will follow up serum Na in 4 hours.    Darel Hong, AGACNP-BC [facility] Pager: [phone]

## 2019-07-24 NOTE — Progress Notes (Signed)
CRITICAL VALUE STICKER  CRITICAL VALUE: Na 162  RECEIVER (on-site recipient of call):Akshith Moncus Marlowe Sax, [place] NOTIFIED: 07/24/2019 1935  MD NOTIFIED: N/A a this value is trending down.   RESPONSE: none needed at this time

## 2019-07-24 NOTE — H&P (Signed)
History and Physical    AKHILESH SPARR [id number] DOB: 03-13-1932 DOA: 07/23/2019  PCP: Kirk Ruths, MD  Patient coming from: nursing home    Chief Complaint: altered mental status   HPI: Kenneth Knight is a 85 y.o. male, total care dependent with advanced dementia with a history of peripheral vascular disease and hypertension who was brought to the emergency room because of abnormal mental status.  She was currently being treated for a UTI at the nursing home.  He was very lethargic and unable to participate in the history.  As of the history is taken from the ER records.  On arrival in the emergency room he was afebrile with a temperature of 98.3 pulse 87, respirations 17 and blood pressure of 130/102 with normal O2 sat on room air of 95.  In his work-up he had a white cell count of 11,000.  Also significant on his blood work with a serum sodium of 168 with chloride of 130 and a creatinine of 1.58 above his baseline of 0.66 a few months prior.  He was also noted to have mild elevation in his transaminases.  BNP was slightly elevated at 114.  His urinalysis was strongly consistent with UTI.  Chest x-ray showed no acute findings.  Influenza and Covid test were both negative.  Patient was started on IV antibiotics and IV hydration and hospitalist consult requested admission.  CODE STATUS was discussed with daughter who elected to place patient DNR.   Review of Systems: unable to obtain due to altered mental status  Past Medical History:  Diagnosis Date  . Alzheimer's dementia ([place])   . Anemia   . Cancer ([place])   . Falls   . Hyperlipidemia   . Hypertension   . Major depressive disorder     Past Surgical History:  Procedure Laterality Date  . HIP SURGERY     right hip replacement  . LOWER EXTREMITY ANGIOGRAPHY Right 02/01/2018   Procedure: LOWER EXTREMITY ANGIOGRAPHY;  Surgeon: Algernon Huxley, MD;  Location: [facility];  Service: Cardiovascular;  Laterality: Right;      reports that he has never smoked. He has never used smokeless tobacco. He reports that he does not drink alcohol or use drugs.  No Known Allergies  Family History  Problem Relation Age of Onset  . Diabetes Sister   . Heart attack Brother   . Heart disease Brother   . Hypertension Brother      Prior to Admission medications   Medication Sig Start Date End Date Taking? Authorizing Provider  acetaminophen (TYLENOL) 500 MG tablet Take 500 mg by mouth every 6 (six) hours as needed.    [provider]  aspirin 81 MG chewable tablet Chew 81 mg by mouth daily.    [provider]  atorvastatin (LIPITOR) 10 MG tablet Take 1 tablet (10 mg total) by mouth daily. 02/01/18 05/29/19  Algernon Huxley, MD  Cholecalciferol (VITAMIN D PO) Take by mouth.    [provider]  citalopram (CELEXA) 20 MG tablet  01/31/19   [provider]  donepezil (ARICEPT) 10 MG tablet  12/08/15   [provider]  Melatonin 3 MG TABS Take by mouth at bedtime.    [provider]  memantine (NAMENDA) 10 MG tablet  12/01/15   [provider]  VITAMIN E PO Take by mouth.    [provider]    Physical Exam: Vitals:   07/23/19 2144 07/23/19 2152 07/23/19 2153  BP:  (!) 133/102   Pulse:  87   Resp:  17   Temp:  98.3 F (36.8 C)   TempSrc:  Axillary   SpO2: 97% 95%   Weight:   79.4 kg  Height:   5\' 10"  (1.778 m)    Constitutional: NAD, calm, comfortable Vitals:   07/23/19 2144 07/23/19 2152 07/23/19 2153  BP:  (!) 133/102   Pulse:  87   Resp:  17   Temp:  98.3 F (36.8 C)   TempSrc:  Axillary   SpO2: 97% 95%   Weight:   79.4 kg  Height:   5\' 10"  (1.778 m)    Constitutional: Patient is very lethargic, arouses to shaking.  He is lying with contraction in all 4 extremities Eyes: PERRL, lids and conjunctivae normal ENMT: Mucous membranes dry Neck: normal, supple,  Respiratory: clear to auscultation bilaterally, no wheezing, no crackles.  Cardiovascular: Regular rate and rhythm, no murmurs / rubs / gallops. Abdomen: no tenderness, no masses palpated.  Bowel sounds positive.  Musculoskeletal: Contracted extremities x4  skin: There is bluish discoloration to the distal right foot Neurologic: contracted state, difficult to assess Psychiatric:altered mental status   Labs on Admission: I have personally reviewed following labs and imaging studies  CBC: Recent Labs  Lab 07/23/19 2151  WBC 11.3*  NEUTROABS 9.3*  HGB 16.4  HCT 53.3*  MCV 104.3*  PLT [id number]   Basic Metabolic Panel: Recent Labs  Lab 07/23/19 2151  NA 168*  K 3.7  CL >130*  CO2 26  GLUCOSE 182*  BUN 56*  CREATININE 1.58*  CALCIUM 9.2  MG 2.8*   GFR: Estimated Creatinine Clearance: 35.3 mL/min (A) (by C-G formula based on SCr of 1.58 mg/dL (H)). Liver Function Tests: Recent Labs  Lab 07/23/19 2151  AST 48*  ALT 64*  ALKPHOS 226*  BILITOT 0.8  PROT 6.5  ALBUMIN 2.9*   No results for input(s): LIPASE, AMYLASE in the last 168 hours. No results for input(s): AMMONIA in the last 168 hours. Coagulation Profile: No results for input(s): INR, PROTIME in the last 168 hours. Cardiac Enzymes: No results for input(s): CKTOTAL, CKMB, CKMBINDEX, TROPONINI in the last 168 hours. BNP (last 3 results) No results for input(s): PROBNP in the last 8760 hours. HbA1C: No results for input(s): HGBA1C in the last 72 hours. CBG: No results for input(s): GLUCAP in the last 168 hours. Lipid Profile: No results for input(s): CHOL, HDL, LDLCALC, TRIG, CHOLHDL, LDLDIRECT in the last 72 hours. Thyroid Function Tests: Recent Labs    07/23/19 2151  TSH 3.554  FREET4 0.73   Anemia Panel: No results for input(s): VITAMINB12, FOLATE, FERRITIN, TIBC, IRON, RETICCTPCT in the last 72 hours. Urine analysis:    Component Value Date/Time   COLORURINE YELLOW (A) 07/23/2019 2329   APPEARANCEUR CLOUDY (A) 07/23/2019 2329   APPEARANCEUR Hazy 06/19/2014 2242   LABSPEC  1.020 07/23/2019 2329   LABSPEC 1.024 06/19/2014 2242   PHURINE 5.0 07/23/2019 2329   GLUCOSEU NEGATIVE 07/23/2019 2329   GLUCOSEU Negative 06/19/2014 2242   HGBUR MODERATE (A) 07/23/2019 2329   BILIRUBINUR NEGATIVE 07/23/2019 2329   BILIRUBINUR Negative 06/19/2014 [place] 07/23/2019 2329   PROTEINUR NEGATIVE 07/23/2019 2329   NITRITE POSITIVE (A) 07/23/2019 2329   LEUKOCYTESUR LARGE (A) 07/23/2019 2329   LEUKOCYTESUR 3+ 06/19/2014 2242    Radiological Exams on Admission: DG Chest 1 View  Result Date: 07/23/2019 CLINICAL DATA:  Shortness of breath EXAM: CHEST  1  VIEW COMPARISON:  Radiograph 06/19/2014 FINDINGS: Chronically low lung volumes with elevation of the right hemidiaphragm similar to priors. Some streaky basilar atelectatic changes are noted. No consolidative opacity, pneumothorax, effusion, or convincing features of edema. Mild cardiomegaly is more pronounced than on prior portable radiography. The aorta is calcified. The remaining cardiomediastinal contours are unremarkable. No acute osseous or soft tissue abnormality. Degenerative changes are present in the imaged spine and shoulders. Diffuse gaseous distention of the stomach and likely interposition of the colon anterior to the liver with distended air-filled loops of bowel in the right upper quadrant. IMPRESSION: 1. Chronically low lung volumes with elevation of the right hemidiaphragm and streaky bibasilar atelectasis. 2. Mild cardiomegaly without edema or pleural effusion. 3. Aortic atherosclerosis. Electronically Signed   By: Lovena Le M.D.   On: 07/23/2019 23:10    EKG: Independently reviewed.   Assessment/Plan Acute metabolic encephalopathy --Secondary to UTI dehydration and hyponatremia --Flu and coronavirus test negative --treat each etiology as outlined below --N.p.o. pending speech therapy evaluation  UTI --IV ceftriaxone for 5 days --follow cultures --IV hydration --Patient did not meet  sepsis criteria on arrival  Acute hypernatremia/hyperchloremia --Secondary to poor oral intake from current illness --IV hydration with D5W.  Monitor serum sodium    AKI (acute kidney injury) ([place]) --Secondary to dehydration, likely from poor oral intake account of altered mental status --- IV hydration as above and monitor renal function    Abnormal LFTs --- Uncertain etiology,  Discoloration/blue distal right foot, with history of peripheral vascular disease --- Arterial Doppler to evaluate for acute ischemia right foot    Total self-care deficit/functional quadriplegia related to advanced dementia and contracted state --Increased nursing care   DVT prophylaxis: lovenox Code Status: DNR Family Communication:Spoke with daughter Rodena Piety who elects to place patient DNR  Disposition Plan: SNF  Athena Masse MD [facility]   If 7PM-7AM, please contact night-coverage [email or web address] Password [facility]  07/24/2019, 12:37 AM

## 2019-07-25 ENCOUNTER — Encounter
Admission: EM | Disposition: A | Discharge status: Hospice | Payer: Self-pay | Source: Home / Self Care | Attending: Internal Medicine

## 2019-07-25 ENCOUNTER — Encounter: Payer: Self-pay | Admitting: Internal Medicine

## 2019-07-25 DIAGNOSIS — G9341 Metabolic encephalopathy: Secondary | ICD-10-CM

## 2019-07-25 DIAGNOSIS — E872 Acidosis, unspecified: Secondary | ICD-10-CM

## 2019-07-25 DIAGNOSIS — Z66 Do not resuscitate: Secondary | ICD-10-CM | POA: Diagnosis present

## 2019-07-25 DIAGNOSIS — Z515 Encounter for palliative care: Secondary | ICD-10-CM

## 2019-07-25 DIAGNOSIS — N3000 Acute cystitis without hematuria: Secondary | ICD-10-CM

## 2019-07-25 DIAGNOSIS — Z7189 Other specified counseling: Secondary | ICD-10-CM

## 2019-07-25 LAB — CBC
HCT: 46.6 % (ref 39.0–52.0)
Hemoglobin: 14.2 g/dL (ref 13.0–17.0)
MCH: 31.8 pg (ref 26.0–34.0)
MCHC: 30.5 g/dL (ref 30.0–36.0)
MCV: 104.3 fL — ABNORMAL HIGH (ref 80.0–100.0)
Platelets: 125 10*3/uL — ABNORMAL LOW (ref 150–400)
RBC: 4.47 MIL/uL (ref 4.22–5.81)
RDW: 14.6 % (ref 11.5–15.5)
WBC: 9.2 10*3/uL (ref 4.0–10.5)
nRBC: 0.2 % (ref 0.0–0.2)

## 2019-07-25 LAB — COMPREHENSIVE METABOLIC PANEL
ALT: 51 U/L — ABNORMAL HIGH (ref 0–44)
AST: 46 U/L — ABNORMAL HIGH (ref 15–41)
Albumin: 2.6 g/dL — ABNORMAL LOW (ref 3.5–5.0)
Alkaline Phosphatase: 202 U/L — ABNORMAL HIGH (ref 38–126)
Anion gap: 8 (ref 5–15)
BUN: 41 mg/dL — ABNORMAL HIGH (ref 8–23)
CO2: 26 mmol/L (ref 22–32)
Calcium: 8.3 mg/dL — ABNORMAL LOW (ref 8.9–10.3)
Chloride: 124 mmol/L — ABNORMAL HIGH (ref 98–111)
Creatinine, Ser: 1.22 mg/dL (ref 0.61–1.24)
GFR calc Af Amer: >60 mL/min (ref >60)
GFR calc non Af Amer: 54 mL/min — ABNORMAL LOW (ref >60)
Glucose, Bld: 198 mg/dL — ABNORMAL HIGH (ref 70–99)
Potassium: 3.2 mmol/L — ABNORMAL LOW (ref 3.5–5.1)
Sodium: 158 mmol/L — ABNORMAL HIGH (ref 135–145)
Total Bilirubin: 0.7 mg/dL (ref 0.3–1.2)
Total Protein: 5.6 g/dL — ABNORMAL LOW (ref 6.5–8.1)

## 2019-07-25 LAB — BASIC METABOLIC PANEL
Anion gap: 12 (ref 5–15)
Anion gap: 8 (ref 5–15)
BUN: 38 mg/dL — ABNORMAL HIGH (ref 8–23)
BUN: 35 mg/dL — ABNORMAL HIGH (ref 8–23)
CO2: 23 mmol/L (ref 22–32)
CO2: 25 mmol/L (ref 22–32)
Calcium: 8.4 mg/dL — ABNORMAL LOW (ref 8.9–10.3)
Calcium: 8.5 mg/dL — ABNORMAL LOW (ref 8.9–10.3)
Chloride: 124 mmol/L — ABNORMAL HIGH (ref 98–111)
Chloride: 124 mmol/L — ABNORMAL HIGH (ref 98–111)
Creatinine, Ser: 1.26 mg/dL — ABNORMAL HIGH (ref 0.61–1.24)
Creatinine, Ser: 1.18 mg/dL (ref 0.61–1.24)
GFR calc Af Amer: 60 mL/min — ABNORMAL LOW (ref >60)
GFR calc Af Amer: >60 mL/min (ref >60)
GFR calc non Af Amer: 52 mL/min — ABNORMAL LOW (ref >60)
GFR calc non Af Amer: 56 mL/min — ABNORMAL LOW (ref >60)
Glucose, Bld: 194 mg/dL — ABNORMAL HIGH (ref 70–99)
Glucose, Bld: 126 mg/dL — ABNORMAL HIGH (ref 70–99)
Potassium: 3.5 mmol/L (ref 3.5–5.1)
Potassium: 3.4 mmol/L — ABNORMAL LOW (ref 3.5–5.1)
Sodium: 159 mmol/L — ABNORMAL HIGH (ref 135–145)
Sodium: 157 mmol/L — ABNORMAL HIGH (ref 135–145)

## 2019-07-25 LAB — MAGNESIUM: Magnesium: 2.7 mg/dL — ABNORMAL HIGH (ref 1.7–2.4)

## 2019-07-25 LAB — GLUCOSE, CAPILLARY
Glucose-Capillary: 125 mg/dL — ABNORMAL HIGH (ref 70–99)
Glucose-Capillary: 193 mg/dL — ABNORMAL HIGH (ref 70–99)
Glucose-Capillary: 44 mg/dL — CL (ref 70–99)
Glucose-Capillary: 65 mg/dL — ABNORMAL LOW (ref 70–99)
Glucose-Capillary: 68 mg/dL — ABNORMAL LOW (ref 70–99)
Glucose-Capillary: 88 mg/dL (ref 70–99)
Glucose-Capillary: 96 mg/dL (ref 70–99)

## 2019-07-25 LAB — SODIUM: Sodium: 162 mmol/L (ref 135–145)

## 2019-07-25 SURGERY — LOWER EXTREMITY ANGIOGRAPHY
Anesthesia: Moderate Sedation | Laterality: Right

## 2019-07-25 MED ORDER — DEXTROSE 50 % IV SOLN: 12.5000 g | INTRAVENOUS | Status: AC

## 2019-07-25 MED ORDER — MIDAZOLAM HCL 2 MG/2ML IJ SOLN: 2.0000 mg | INTRAMUSCULAR | Status: DC | PRN

## 2019-07-25 MED ORDER — MORPHINE SULFATE (CONCENTRATE) 10 MG/0.5ML PO SOLN: 5.0000 mg | ORAL | Status: DC | PRN

## 2019-07-25 MED ORDER — GLYCOPYRROLATE 1 MG PO TABS
1.0000 mg | ORAL_TABLET | ORAL | Status: DC | PRN
  Filled 2019-07-25: qty 1

## 2019-07-25 MED ORDER — DEXTROSE 50 % IV SOLN
INTRAVENOUS | Status: AC
  Administered 2019-07-25: 05:00:00 50 mL via INTRAVENOUS
  Filled 2019-07-25: qty 50

## 2019-07-25 MED ORDER — ACETAMINOPHEN 325 MG PO TABS: 650.0000 mg | ORAL_TABLET | Freq: Four times a day (QID) | ORAL | Status: DC | PRN

## 2019-07-25 MED ORDER — DEXTROSE 5 % IV SOLN: INTRAVENOUS | Status: DC

## 2019-07-25 MED ORDER — HALOPERIDOL LACTATE 5 MG/ML IJ SOLN: 2.0000 mg | INTRAMUSCULAR | Status: DC | PRN

## 2019-07-25 MED ORDER — DEXTROSE 50 % IV SOLN
1.0000 | INTRAVENOUS | Status: DC | PRN
  Administered 2019-07-25: 50 mL via INTRAVENOUS
  Filled 2019-07-25: qty 50

## 2019-07-25 MED ORDER — GLYCOPYRROLATE 0.2 MG/ML IJ SOLN: 0.2000 mg | INTRAMUSCULAR | Status: DC | PRN

## 2019-07-25 MED ORDER — HALOPERIDOL LACTATE 2 MG/ML PO CONC
2.0000 mg | ORAL | Status: DC | PRN
  Filled 2019-07-25: qty 1

## 2019-07-25 MED ORDER — DIPHENHYDRAMINE HCL 50 MG/ML IJ SOLN: 12.5000 mg | INTRAMUSCULAR | Status: DC | PRN

## 2019-07-25 MED ORDER — POLYVINYL ALCOHOL 1.4 % OP SOLN: 1.0000 [drp] | Freq: Four times a day (QID) | OPHTHALMIC | Status: DC | PRN

## 2019-07-25 MED ORDER — ATROPINE SULFATE 1 % OP SOLN
4.0000 [drp] | OPHTHALMIC | Status: DC | PRN
  Filled 2019-07-25: qty 2

## 2019-07-25 MED ORDER — LORAZEPAM 2 MG/ML IJ SOLN: 1.0000 mg | INTRAMUSCULAR | Status: DC | PRN

## 2019-07-25 MED ORDER — HALOPERIDOL 2 MG PO TABS
2.0000 mg | ORAL_TABLET | ORAL | Status: DC | PRN
  Filled 2019-07-25: qty 1

## 2019-07-25 MED ORDER — LORAZEPAM 1 MG PO TABS: 1.0000 mg | ORAL_TABLET | ORAL | Status: DC | PRN

## 2019-07-25 MED ORDER — MORPHINE SULFATE (PF) 2 MG/ML IV SOLN
2.0000 mg | INTRAVENOUS | Status: DC | PRN
  Administered 2019-07-26: 20:00:00 2 mg via INTRAVENOUS
  Filled 2019-07-25: qty 1

## 2019-07-25 MED ORDER — ACETAMINOPHEN 650 MG RE SUPP: 650.0000 mg | Freq: Four times a day (QID) | RECTAL | Status: DC | PRN

## 2019-07-25 MED ORDER — FLEET ENEMA 7-19 GM/118ML RE ENEM: 1.0000 | ENEMA | Freq: Every day | RECTAL | Status: DC | PRN

## 2019-07-25 MED ORDER — ONDANSETRON 4 MG PO TBDP
4.0000 mg | ORAL_TABLET | Freq: Four times a day (QID) | ORAL | Status: DC | PRN
  Filled 2019-07-25: qty 1

## 2019-07-25 MED ORDER — POLYVINYL ALCOHOL 1.4 % OP SOLN
1.0000 [drp] | Freq: Four times a day (QID) | OPHTHALMIC | Status: DC | PRN
  Filled 2019-07-25: qty 15

## 2019-07-25 MED ORDER — LORAZEPAM 2 MG/ML PO CONC
1.0000 mg | ORAL | Status: DC | PRN
  Filled 2019-07-25: qty 0.5

## 2019-07-25 MED ORDER — DIPHENHYDRAMINE HCL 50 MG/ML IJ SOLN: 25.0000 mg | INTRAMUSCULAR | Status: DC | PRN

## 2019-07-25 MED ORDER — ONDANSETRON HCL 4 MG/2ML IJ SOLN: 4.0000 mg | Freq: Four times a day (QID) | INTRAMUSCULAR | Status: DC | PRN

## 2019-07-25 MED ORDER — ALBUTEROL SULFATE (2.5 MG/3ML) 0.083% IN NEBU: 2.5000 mg | INHALATION_SOLUTION | RESPIRATORY_TRACT | Status: DC | PRN

## 2019-07-25 MED ORDER — BIOTENE DRY MOUTH MT LIQD: 15.0000 mL | OROMUCOSAL | Status: DC | PRN

## 2019-07-25 NOTE — Progress Notes (Signed)
[facility] Daily Progress Note   Subjective: Rapid response yesterday for episode of unresponsiveness, hypoxia and hypoglycemia.  Transferred to stepdown.   Objective: Vitals:   07/25/19 0000 07/25/19 0411 07/25/19 0600 07/25/19 0756  BP: 130/90 109/71  102/64  Pulse: 79 71 67 68  Resp: (!) 21 20 14 17   Temp: 98.3 F (36.8 C) 98.2 F (36.8 C)  98.6 F (37 C)  TempSrc: Oral Oral  Axillary  SpO2: 96% 100% 98% 97%  Weight:      Height:        Intake/Output Summary (Last 24 hours) at 07/25/2019 1024 Last data filed at 07/25/2019 0600 Gross per 24 hour  Intake 966.15 ml  Output 75 ml  Net 891.15 ml   Physical Exam: With advanced dementia, NAD CV: RRR Pulmonary: CTA Bilaterally Abdomen: Soft, Nontender, Nondistended Vascular:  Right Lower extremity: Thigh soft. Calf soft. Contracted. Warm distally however does becomes cooler at the foot. Slightly improved erythema.    Laboratory: CBC    Component Value Date/Time   WBC 9.2 07/25/2019 0609   HGB 14.2 07/25/2019 0609   HGB 12.2 (L) 07/11/2014 1248   HCT 46.6 07/25/2019 0609   HCT 38.1 (L) 07/11/2014 1248   PLT 125 (L) 07/25/2019 0609   PLT 293 07/11/2014 1248   BMET    Component Value Date/Time   NA 158 (H) 07/25/2019 0609   NA 142 06/21/2014 0425   K 3.2 (L) 07/25/2019 0609   K 3.9 06/21/2014 0425   CL 124 (H) 07/25/2019 0609   CL 110 (H) 06/21/2014 0425   CO2 26 07/25/2019 0609   CO2 25 06/21/2014 0425   GLUCOSE 198 (H) 07/25/2019 0609   GLUCOSE 77 06/21/2014 0425   BUN 41 (H) 07/25/2019 0609   BUN 19 (H) 06/21/2014 0425   CREATININE 1.22 07/25/2019 0609   CREATININE 0.98 06/21/2014 0425   CALCIUM 8.3 (L) 07/25/2019 0609   CALCIUM 7.7 (L) 06/21/2014 0425   GFRNONAA 54 (L) 07/25/2019 0609   GFRNONAA >60 06/21/2014 0425   GFRNONAA >60 06/27/2013 1858   GFRAA >60 07/25/2019 0609   GFRAA >60 06/21/2014 0425   GFRAA >60 06/27/2013 1858   Assessment/Planning: The patient is an  85 year old male with known history of peripheral artery disease who presented to the [facility] emergency department with mental status found to have erythema and bluish tint to right foot 1) peripheral artery disease: Known patient to our service.  Known peripheral artery disease.  During last outpatient visit patient with moderate atherosclerotic disease as per ABI.  At the time, the family did not want to move forward with any invasive procedures.  Discussed the possibility of moving forward with angiogram yesterday / today with daughter Rodena Piety [facility]).  At this time, family would like to hold off, speak to palliative care and assess for any clinical improvement. Will re-assess early next week. Patient is on ASA and statin as outpatient for medical management.   Discussed with Dr. Ellis Parents Clydie Dillen PA-C 07/25/2019 10:24 AM

## 2019-07-25 NOTE — Progress Notes (Signed)
1        [facility] [place] NAME: Kenneth Knight    MR#:  [id number]  DATE OF BIRTH:  [birth year]  SUBJECTIVE:  CHIEF COMPLAINT:   Chief Complaint  Patient presents with  . Abnormal Lab   Remains lethargic.  No acute complaint no nausea no vomiting.  Attempting to communicate when called her name.   REVIEW OF SYSTEMS:  ROS unable to obtain due to his mental status DRUG ALLERGIES:  No Known Allergies VITALS:  Blood pressure 115/78, pulse 76, temperature 97.9 F (36.6 C), temperature source Axillary, resp. rate 14, height 5\' 7"  (1.702 m), weight 63.3 kg, SpO2 100 %. PHYSICAL EXAMINATION:  Physical Exam Constitutional:      Appearance: He is cachectic. He is ill-appearing and toxic-appearing.  HENT:     Head: Normocephalic and atraumatic.  Eyes:     Conjunctiva/sclera: Conjunctivae normal.     Pupils: Pupils are equal, round, and reactive to light.  Neck:     Thyroid: No thyromegaly.     Trachea: No tracheal deviation.  Cardiovascular:     Rate and Rhythm: Normal rate and regular rhythm.     Heart sounds: Normal heart sounds.  Pulmonary:     Effort: Pulmonary effort is normal. No respiratory distress.     Breath sounds: Normal breath sounds. No wheezing.  Chest:     Chest wall: No tenderness.  Abdominal:     General: Bowel sounds are normal. There is no distension.     Palpations: Abdomen is soft.     Tenderness: There is no abdominal tenderness.  Musculoskeletal:        General: Normal range of motion.     Cervical back: Normal range of motion and neck supple.     Comments: Laying in fetal position  Feet:     Comments: Foot is erythematous from the ankle to toes.  Seems to be tender to palpation on exam.  Unable to palpate pedal pulses  bluish discoloration to the distal right foot Skin:    General: Skin is warm and dry.     Findings: No rash.  Neurological:     Mental Status: He is unresponsive.     Cranial Nerves: No cranial nerve  deficit.     Comments: He is lying with contraction of all 4 extremities  Psychiatric:        Behavior: Behavior is uncooperative.     Comments: Unable to evaluate due to his mental status    LABORATORY PANEL:  Male CBC Recent Labs  Lab 07/25/19 0609  WBC 9.2  HGB 14.2  HCT 46.6  PLT 125*   ------------------------------------------------------------------------------------------------------------------ Chemistries  Recent Labs  Lab 07/25/19 0609 07/25/19 1620  NA 158* 157*  K 3.2* 3.4*  CL 124* 124*  CO2 26 25  GLUCOSE 198* 126*  BUN 41* 35*  CREATININE 1.22 1.18  CALCIUM 8.3* 8.5*  MG 2.7*  --   AST 46*  --   ALT 51*  --   ALKPHOS 202*  --   BILITOT 0.7  --    RADIOLOGY:  No results found. ASSESSMENT AND PLAN:   Acute metabolic encephalopathy/unresponsiveness --Secondary to UTI dehydration and hyponatremia --Flu and coronavirus test negative  Hypoglycemia -On D5, now stopped due to comfort care  UTI --IV ceftriaxone for now and can change once urine culture results --IV hydration Now started to comfort care  Acute hypernatremia/hyperchloremia --Secondary to poor oral intake from current illness --IV  hydration with D5W.    Now started to comfort care    AKI (acute kidney injury) --Secondary to dehydration, likely from poor oral intake account of altered mental status --- IV hydration as above and monitor renal function Now comfort care    Abnormal LFTs --- Uncertain etiology  Discoloration/blue distal right foot, with history of peripheral vascular disease - Vascular planning for angiogram Thursday or Friday depending on his clinical condition  Total self-care deficit/functional quadriplegia related to advanced dementia and contracted state, -Increased nursing care.  Has been at the facilities for last at least 3 to 4 years And extensive discussion with the family as well as palliative care and ICU team.  The patient is currently transition  to comfort care with the goal to transfer to residential hospice.   All the records are reviewed and case discussed with Care Management/Social Worker. Management plans discussed with the patient, family (daughter Rodena Piety) and they are in agreement.  CODE STATUS: DNR  TOTAL TIME (critical care) TAKING CARE OF THIS PATIENT: 45 minutes.   More than 50% of the time was spent in counseling/coordination of care: YES  POSSIBLE D/C IN 2-3 DAYS, DEPENDING ON CLINICAL CONDITION.  And vascular evaluation   Berle Mull M.D on 07/25/2019 at 7:16 PM  After 6pm go to [email or web address] - password [email or web address]  [facility]   CC: Primary care physician; Kirk Ruths, MD  Note: This dictation was prepared with Dragon dictation along with smaller phrase technology. Any transcriptional errors that result from this process are unintentional.

## 2019-07-25 NOTE — Progress Notes (Signed)
Chart reviewed noting comfort care. Will hold off on any further assessment unless reconsulted.

## 2019-07-25 NOTE — Progress Notes (Signed)
Spoke with MD Posey Pronto on phone, MD stated to do CBG PRN if suspecting low blood sugar but go by Q4h BMP results.

## 2019-07-25 NOTE — Plan of Care (Signed)
Unable to tell if pt orientated, updated pt on POC, need for reinforcement of teaching

## 2019-07-25 NOTE — Consult Note (Signed)
[place] Nurse Consult Note: Reason for Consult: RLE vascular wounds scabbed abrasion on right shin, bilateral boggy heels, RLE erythema from ankles to toes with cyanotic toes although warm to touch Wound type:vascular impairment to right lower extremity Pressure Injury POA: NA Wound [id number] anterior lower leg with scabbed lesion Drainage (amount, consistency, odor) none Periwound: cool to touch Dressing procedure/placement/frequency: Cleanse right anterior lower leg wound with NS and pat dry.  Apply silicone foam dressing.  VAscular intervention pending.  Will not follow at this time.  Please re-consult if needed.  Domenic Moras MSN, RN, FNP-BC CWON Wound, Ostomy, Continence Nurse Pager [phone]

## 2019-07-25 NOTE — Progress Notes (Signed)
Informed MD Patel of 44 BG and that hands are cold at 0926, MD ordered stat BMP to verify glucose.  Called lab notified of stat lab.

## 2019-07-25 NOTE — Progress Notes (Signed)
Secure message sent to MD Posey Pronto about glucose 194 on BMP.

## 2019-07-25 NOTE — Accreditation Note (Signed)
CRITICAL VALUE ALERT  Critical Value: NA  Date & Time Notied:  07/25/2019  Provider Notified: Quita Skye NP  Orders Received/Actions taken: current treatment in progress

## 2019-07-25 NOTE — Consult Note (Signed)
Consultation Note Date: 07/25/2019   Patient Name: Kenneth Knight  DOB: February 14, 1932  MRN: [id number]  Age / Sex: 85 y.o., male  PCP: Kirk Ruths, MD Referring Physician: Lavina Hamman, MD  Reason for Consultation: Establishing goals of care and Psychosocial/spiritual support  HPI/Patient Profile: 85 y.o. male  with past medical history of dementia, depression, HTN, HLD, Falls, dCHF, Anemia, Prostate CA, RLE angiography 01/2018, hip surgery admitted on 07/23/2019 with severe hypernatremia and hyperchloremia 2/2 poor po intake, acute renal dt volume depletion, atherosclerotic changes RLE discoloration/warm, [place] with occlusion of posterior tibial artery.   Clinical Assessment and Goals of Care: Kenneth Knight is resting quietly in bed.  He appears acutely/chronically ill and frail.  He has known dementia, and at this point, seems unable to make his needs known. He will look in my direction, but not make eye contact. He whispers something inaudible when asked his name. There is no family at bedside at that time. Attending is at bedside.   Call to daughter Josen Knight at [phone].  Her husband is on the call with [place].  We talk about Kenneth Knight's acute and chronic health issues.  I ask if they have talked with hospitalist. She tells me that she understands that her father has a blood clot in leg, and there are toxins building up in his body.  Kenneth Knight shares that they talked about comfort care, and family has elected FULL COMFORT care after family comes to visit this afternoon.    We talk about what comfort care looks like, no further lab draws or IV sticks, no IV antibiotics or IV fluids.  Family states that Mrs. Clendenon (Kenneth Knight wife) died last month.    We talk about residential hospice, and family shares their concern about transport.  I share that we would not move Kenneth Knight if he were unstable. They also  ask about visitation policy.    HCPOA   NEXT OF KIN - Kenneth Knight tells me that she and her brother Kenneth Knight share decision making.    SUMMARY OF RECOMMENDATIONS   FULL COMFORT care to start after family visits this afternoon.    Code Status/Advance Care Planning:  DNR  Symptom Management:   Per hospitalist, no additional needs at this time.   Palliative Prophylaxis:   Asp[place], Palliative Wound Care and Turn Reposition  Additional Recommendations (Limitations, Scope, Preferences):  Full Comfort Care  Psycho-social/Spiritual:   Desire for further Chaplaincy support:no  Additional Recommendations: Caregiving  Support/Resources and Education on Hospice  Prognosis:   < 2 weeks  Discharge Planning: agreeable to residential Hospice, [place] location.        Primary Diagnoses: Present on Admission: . Dementia ([place]) . (Resolved) Prostate cancer ([place]) . Acute hypernatremia . AKI (acute kidney injury) ([place]) . Abnormal LFTs . Total self-care deficit . Hypernatremia . Acute lower UTI . Acute metabolic encephalopathy . DNR (do not resuscitate)   I have reviewed the medical record, interviewed the patient and family, and examined the patient. The  following aspects are pertinent.  Past Medical History:  Diagnosis Date  . Alzheimer's dementia ([place])   . Anemia   . Cancer ([place])   . Falls   . Hyperlipidemia   . Hypertension   . Major depressive disorder    Social History   Socioeconomic History  . Marital status: Married    Spouse name: Not on file  . Number of children: Not on file  . Years of education: Not on file  . Highest education level: Not on file  Occupational History  . Not on file  Tobacco Use  . Smoking status: Never Smoker  . Smokeless tobacco: Never Used  Substance and Sexual Activity  . Alcohol use: No  . Drug use: No  . Sexual activity: Not on file  Other Topics Concern  . Not on file  Social History Narrative  . Not on file   Social  Determinants of Health   Financial Resource Strain:   . Difficulty of Paying Living Expenses: Not on file  Food Insecurity:   . Worried About Charity fundraiser in the Last Year: Not on file  . Ran Out of Food in the Last Year: Not on file  Transportation Needs:   . Lack of Transportation (Medical): Not on file  . Lack of Transportation (Non-Medical): Not on file  Physical Activity:   . Days of Exercise per Week: Not on file  . Minutes of Exercise per Session: Not on file  Stress:   . Feeling of Stress : Not on file  Social Connections:   . Frequency of Communication with Friends and Family: Not on file  . Frequency of Social Gatherings with Friends and Family: Not on file  . Attends Religious Services: Not on file  . Active Member of Clubs or Organizations: Not on file  . Attends Archivist Meetings: Not on file  . Marital Status: Not on file   Family History  Problem Relation Age of Onset  . Diabetes Sister   . Heart attack Brother   . Heart disease Brother   . Hypertension Brother    Scheduled Meds: . chlorhexidine  15 mL Mouth Rinse BID  . Chlorhexidine Gluconate Cloth  6 each Topical Daily  . heparin  5,000 Units Subcutaneous Q8H  . mouth rinse  15 mL Mouth Rinse q12n4p   Continuous Infusions: . cefTRIAXone (ROCEPHIN)  IV Stopped (07/25/19 0051)  . dextrose 85 mL/hr at 07/25/19 0600   PRN Meds:.acetaminophen **OR** acetaminophen, dextrose, lip balm, senna-docusate Medications Prior to Admission:  Prior to Admission medications   Medication Sig Start Date End Date Taking? Authorizing Provider  acetaminophen (TYLENOL) 500 MG tablet Take 500 mg by mouth every 4 (four) hours as needed for mild pain or moderate pain.    Yes [provider]  aspirin 81 MG chewable tablet Chew 81 mg by mouth daily.   Yes [provider]  atorvastatin (LIPITOR) 10 MG tablet Take 1 tablet (10 mg total) by mouth daily. 02/01/18 07/24/19 Yes Dew, Erskine Squibb, MD    Cholecalciferol 25 MCG (1000 UT) capsule Take 2,000 Units by mouth daily.   Yes [provider]  citalopram (CELEXA) 20 MG tablet Take 20 mg by mouth daily.  01/31/19  Yes [provider]  donepezil (ARICEPT) 10 MG tablet Take 10 mg by mouth daily.  12/08/15  Yes [provider]  Melatonin 3 MG TABS Take by mouth at bedtime.   Yes [provider]  memantine [facility])  10 MG tablet Take 10 mg by mouth 2 (two) times daily.  12/01/15  Yes [provider]  polycarbophil (FIBER-LAX) 625 MG tablet Take 1,250 mg by mouth daily.   Yes [provider]  vitamin E 400 UNIT capsule Take 400 Units by mouth daily.    Yes [provider]   No Known Allergies Review of Systems  Unable to perform ROS: Dementia    Physical Exam Vitals and nursing note reviewed.  Constitutional:      General: He is not in acute distress.    Appearance: He is ill-appearing.     Comments: Will briefly look in my direction, but not make eye contact, does not respond in any meaningful way  Cardiovascular:     Rate and Rhythm: Normal rate.  Pulmonary:     Effort: Pulmonary effort is normal.     Breath sounds: Rhonchi present.  Abdominal:     General: Abdomen is flat. There is no distension.  Musculoskeletal:     Comments: Right foot red and swollen  Skin:    General: Skin is warm and dry.     Comments: Multiple wounds on lower extremities as noted by nursing, bilateral LE toenails thickened and discolored  Neurological:     Comments: Known dementia.  Does not respond in any meaningful way.  Tries to whisper when asked his name, unable to determine what he is saying  Psychiatric:     Comments: Calm, not fearful     Vital Signs: BP 102/64 (BP Location: Right Arm)   Pulse 68   Temp 98.6 F (37 C) (Axillary)   Resp 17   Ht 5\' 7"  (1.702 m)   Wt 63.3 kg   SpO2 97%   BMI 21.86 kg/m  Pain Scale: PAINAD   Pain Score: 0-No pain   SpO2: SpO2: 97 % O2  Device:SpO2: 97 % O2 Flow Rate: .O2 Flow Rate (L/min): 2 L/min  IO: Intake/output summary:   Intake/Output Summary (Last 24 hours) at 07/25/2019 [id number] Last data filed at 07/25/2019 0600 Gross per 24 hour  Intake 966.15 ml  Output 75 ml  Net 891.15 ml    LBM: Last BM Date: 07/24/19 Baseline Weight: Weight: 79.4 kg Most recent weight: Weight: 63.3 kg     Palliative Assessment/Data:   Flowsheet Rows     Most Recent Value  Intake Tab  Referral Department  Hospitalist  Unit at Time of Referral  Intermediate Care Unit  Palliative Care Primary Diagnosis  Pulmonary  Date Notified  07/24/19  Palliative Care Type  New Palliative care  Reason for referral  Clarify Goals of Care  Date of Admission  07/23/19  Date first seen by Palliative Care  07/25/19  # of days Palliative referral response time  1 Day(s)  # of days IP prior to Palliative referral  1  Clinical Assessment  Palliative Performance Scale Score  20%  Pain Max last 24 hours  Not able to report  Pain Min Last 24 hours  Not able to report  Dyspnea Max Last 24 Hours  Not able to report  Dyspnea Min Last 24 hours  Not able to report  Psychosocial & Spiritual Assessment  Palliative Care Outcomes      Time In: 1105  Time Out: 1215 Time Total:  70 minutes  Greater than 50%  of this time was spent counseling and coordinating care related to the above assessment and plan.  Signed by: Drue Novel, NP   Please contact  Palliative Medicine Team phone at [phone] for questions and concerns.  For individual provider: See Shea Evans

## 2019-07-25 NOTE — Clinical Social Work Note (Signed)
CSW acknowledges consult for hospice house placement at [facility] in [place]. CSW made referral to Doreatha Lew.  Dayton Scrape, [place]

## 2019-07-25 NOTE — Progress Notes (Signed)
Initial Nutrition Assessment  RD working remotely.  DOCUMENTATION CODES:   Not applicable  INTERVENTION:  No appropriate nutrition interventions at this time. Will monitor for outcome of discussions regarding goals of care and for plan of care.  NUTRITION DIAGNOSIS:   Inadequate oral intake related to inability to eat as evidenced by NPO status.  GOAL:   Patient will meet greater than or equal to 90% of their needs  MONITOR:   Diet advancement, Labs, Weight trends, PO intake, Skin, I & O's  REASON FOR ASSESSMENT:   Consult Other (Comment)(dementia, total care dependence)  ASSESSMENT:   84 year old male with PMHx of Alzheimer's dementia, HTN, HLD, major depressive disorder admitted with severe hypernatremia and hyperchloremia secondary to poor PO intake, acute renal failure due to volume depletion, atherosclerotic changes of RLE, UTI, acute metabolic encephalopathy.   Patient is unable to provide any history at this time. According to chart he has advanced dementia and requires total assistance. Per MD note patient is in dying process and family is discussing goals of care and considering comfort care. Patient is NPO. Will continue to monitor.  According to chart patient used to weigh 79-85 kg. He is currently 63.3 kg (139.55 lbs). Unsure when weight loss occurred.   Medications reviewed and include: ceftriaxone, D5W at 85 mL/hr.  Labs reviewed: CBG 44-193, Sodium 159, BUN 38, Creatinine 1.26.  Patient is at risk for malnutrition but unable to determine if he meets criteria.  NUTRITION - FOCUSED PHYSICAL EXAM:  Unable to complete at this time.  Diet Order:   Diet Order            Diet NPO time specified Except for: Sips with Meds  Diet effective midnight             EDUCATION NEEDS:   No education needs have been identified at this time  Skin:  Skin Assessment: Reviewed RN Assessment  Last BM:  07/24/2019 per chart  Height:   Ht Readings from Last 1  Encounters:  07/24/19 5\' 7"  (1.702 m)   Weight:   Wt Readings from Last 1 Encounters:  07/24/19 63.3 kg   Ideal Body Weight:  67.2 kg  BMI:  Body mass index is 21.86 kg/m.  Estimated Nutritional Needs:   Kcal:  1500-1700  Protein:  75-85 grams  Fluid:  1.5-1.7 L/day  Jacklynn Barnacle, MS, RD, LDN Office: [phone] Pager: [phone] After Hours/Weekend Pager: [phone]

## 2019-07-25 NOTE — Progress Notes (Signed)
Family At bedside, clinical status relayed to family  Updated and notified of patients medical condition-  Progressive multiorgan failure with very low chance of meaningful recovery.  Patient is in dying  Process.  Family understands the situation.  They have consented and agreed to DNR/DNI and would like to proceed with Comfort care measures.   Family are satisfied with Plan of action and management. All questions answered  Ariann Khaimov David Velia Pamer, M.D.  [place] [facility]  Medical Director ICU-[facility]  Medical Director [facility] Cardio-Pulmonary Department     

## 2019-07-25 NOTE — Progress Notes (Signed)
New referral received from Dayton Scrape, SW for inpatient hospice services on Mr. Silvio Clayman.  We currently do not have a bed to offer.  Mr. Voncannon referral information sent to our referral intake and report sent to Dr. Allie Dimmer to determine hospice home appropriateness.  Will continue to follow through out Mr. Nevan's disposition.    Thank you for allowing participation in this patient's care.  Dimas Aguas, RN Clinical Nurse Liaison [facility] [phone] cell

## 2019-07-25 NOTE — Progress Notes (Signed)
Clinical status relayed to family  Updated and notified of patients medical condition-  Progressive multiorgan failure with very low chance of meaningful recovery.  Patient is in a slow dying  Process.  Family understands the situation.  Family are satisfied with Plan of action and management. All questions answered They will discuss amongst themselves and may consider Comfort care measures  Sadye Kiernan Patricia Pesa, M.D.  [facility]  Medical Director [place] Director [facility] Cardio-Pulmonary Department

## 2019-07-25 NOTE — Progress Notes (Signed)
Clinical status relayed to family  Updated and notified of patients medical condition-  Progressive multiorgan failure with very low chance of meaningful recovery.  Patient is in dying  Process.  Family understands the situation. Patient has been progressively declining over last 1 month The family has decided that they don't want him to suffer anymore, he has suffered enough.   They have consented and agreed to DNR/DNI and would like to proceed with Comfort care measures.   Family are satisfied with Plan of action and management. All questions answered  Corrin Parker, M.D.  [facility]  Medical Director [place] Director [facility] Cardio-Pulmonary Department

## 2019-07-25 NOTE — Progress Notes (Signed)
Page sent to MD Posey Pronto for BG of 65.

## 2019-07-26 LAB — URINE CULTURE: Culture: 80000 — AB

## 2019-07-26 NOTE — Progress Notes (Signed)
Patients Catheter has been leaking all day. Patient cleaned and full linen change. Preventative dressing changed on bottom, heels and knees.

## 2019-07-26 NOTE — Care Management Important Message (Signed)
Important Message  Patient Details  Name: CURLEE ROZZI MRN: [id number] Date of Birth: 08-18-1931   Medicare Important Message Given:  Other (see comment)  Patient in dying process per MD note and under comfort care. Delivery of concurrent Medicare IM withheld at this time out of respect for family.   Dannette Barbara 07/26/2019, 8:21 AM

## 2019-07-26 NOTE — Discharge Summary (Signed)
[facility] Discharge Summary   Patient: Kenneth Knight [id number]   PCP: Kirk Ruths, MD DOB: Jul 21, 1932   Date of admission: 07/23/2019   Date of discharge:  07/26/2019    Discharge Diagnoses:   Principal Problem:   Acute hypernatremia Active Problems:   Dementia ([place])   AKI (acute kidney injury) ([place])   Abnormal LFTs   Total self-care deficit   Hypernatremia   Acute lower UTI   Acute metabolic encephalopathy   DNR (do not resuscitate)   Acute cystitis without hematuria   Acidosis   Goals of care, counseling/discussion   Palliative care by specialist   Encounter for hospice care discussion   Admitted From: snf Disposition:  residential hospice   Recommendations for Outpatient Follow-up:  1. PCP: establish care with hospice 2. Follow up LABS/TEST:  none   Diet recommendation: comfort feeds  Activity: The patient is advised to gradually reintroduce usual activities,as tolerated  Discharge Condition: stable  Code Status: DNR   History of present illness: As per the H and P dictated on admission, "Kenneth Knight is a 85 y.o. male, total care dependent with advanced dementia with a history of peripheral vascular disease and hypertension who was brought to the emergency room because of abnormal mental status.  She was currently being treated for a UTI at the nursing home.  He was very lethargic and unable to participate in the history.  As of the history is taken from the ER records.  On arrival in the emergency room he was afebrile with a temperature of 98.3 pulse 87, respirations 17 and blood pressure of 130/102 with normal O2 sat on room air of 95.  In his work-up he had a white cell count of 11,000.  Also significant on his blood work with a serum sodium of 168 with chloride of 130 and a creatinine of 1.58 above his baseline of 0.66 a few months prior.  He was also noted to have mild elevation in his transaminases.  BNP was slightly elevated at 114.  His  urinalysis was strongly consistent with UTI.  Chest x-ray showed no acute findings.  Influenza and Covid test were both negative.  Patient was started on IV antibiotics and IV hydration and hospitalist consult requested admission.  CODE STATUS was discussed with daughter who elected to place patient DNR"  Hospital Course:  Summary of his active problems in the hospital is as following. Acute metabolic encephalopathy/unresponsiveness --Secondary toUTIdehydration and hyponatremia --Fluandcoronavirus test negative  Hypoglycemia -On D5, now stopped due to comfort care  UTI --IV ceftriaxone for now and can change once urine culture results --IV hydration Now on comfort care  Acute hypernatremia/hyperchloremia --Secondary to poor oral intake from current illness --IV hydration with D5W.  Now on comfort care  AKI (acute kidney injury) --Secondary to dehydration, likely from poor oral intake account of altered mental status Now comfort care  Abnormal LFTs ---Uncertain etiology  Discoloration/blue distal right foot, with history of peripheral vascular disease -Vascular was consulted. No changed to comfort care  Total self-care deficit/functional quadriplegiarelated to advanced dementiaand contracted state, -Increased nursing care.  Has been at the facilities for last at least 3 to 4 years And extensive discussion with the family as well as palliative care and ICU team.  The patient is currently transition to comfort care with the goal to transfer to residential hospice.  On the day of the discharge the patient's vitals were stable, and no other acute medical condition were reported by  patient. the patient was felt safe to be discharge at residential hospice with no therapy needed on discharge.  Consultants: Palliative care PCCM Nephrology  Procedures: none  DISCHARGE MEDICATION: Allergies as of 07/26/2019   No Known Allergies     Medication List    STOP taking  these medications   acetaminophen 500 MG tablet Commonly known as: TYLENOL   aspirin 81 MG chewable tablet   atorvastatin 10 MG tablet Commonly known as: Lipitor   Cholecalciferol 25 MCG (1000 UT) capsule   citalopram 20 MG tablet Commonly known as: CELEXA   donepezil 10 MG tablet Commonly known as: ARICEPT   Fiber-Lax 625 MG tablet Generic drug: polycarbophil   Melatonin 3 MG Tabs   memantine 10 MG tablet Commonly known as: NAMENDA   vitamin E 400 UNIT capsule      No Known Allergies Discharge Instructions    Diet - low sodium heart healthy   Complete by: As directed    Increase activity slowly   Complete by: As directed      Discharge Exam: [facility] Weights   07/23/19 2153 07/24/19 1718  Weight: 79.4 kg 63.3 kg   Vitals:   07/25/19 2046 07/26/19 0441  BP: 124/82 116/76  Pulse: 87 94  Resp: 15 17  Temp: 98.7 F (37.1 C) 99.8 F (37.7 C)  SpO2: 98% 100%   General: Appear in moderate distress, no Rash; Oral Mucosa Clear, dry. no Abnormal Mass Or lumps Cardiovascular: S1 and S2 Present, aortic systolic  Murmur, Respiratory: increased respiratory effort, Bilateral Air entry present and bilateral  Crackles, bilateral  wheezes Abdomen: Bowel Sound present, Soft and difficult to assess  tenderness, no hernia Extremities: no Pedal edema, difficult to assess calf tenderness Neurology: lethargic and not oriented to time, place, and person affect unresponsive.  The results of significant diagnostics from this hospitalization (including imaging, microbiology, ancillary and laboratory) are listed below for reference.    Significant Diagnostic Studies: DG Chest 1 View  Result Date: 07/23/2019 CLINICAL DATA:  Shortness of breath EXAM: CHEST  1 VIEW COMPARISON:  Radiograph 06/19/2014 FINDINGS: Chronically low lung volumes with elevation of the right hemidiaphragm similar to priors. Some streaky basilar atelectatic changes are noted. No consolidative opacity,  pneumothorax, effusion, or convincing features of edema. Mild cardiomegaly is more pronounced than on prior portable radiography. The aorta is calcified. The remaining cardiomediastinal contours are unremarkable. No acute osseous or soft tissue abnormality. Degenerative changes are present in the imaged spine and shoulders. Diffuse gaseous distention of the stomach and likely interposition of the colon anterior to the liver with distended air-filled loops of bowel in the right upper quadrant. IMPRESSION: 1. Chronically low lung volumes with elevation of the right hemidiaphragm and streaky bibasilar atelectasis. 2. Mild cardiomegaly without edema or pleural effusion. 3. Aortic atherosclerosis. Electronically Signed   By: Lovena Le M.D.   On: 07/23/2019 23:10   US ARTERIAL LOWER EXTREMITY DUPLEX RIGHT(NON-ABI)  Result Date: 07/24/2019 CLINICAL DATA:  85 year old male with a history of lower limb ischemia EXAM: NONINVASIVE PHYSIOLOGIC VASCULAR STUDY OF RIGHT LOWER EXTREMITIES TECHNIQUE: Evaluation of right lower extremities was performed at rest, including directed duplex. COMPARISON:  None. FINDINGS: Right ABI:  Not recorded Right Lower Extremity: Directed duplex of the right lower extremity demonstrates triphasic common femoral artery, profunda femoris, SFA, popliteal artery. Monophasic flow of the anterior tibial artery. Posterior tibial artery is occluded Left Lower Extremity: IMPRESSION: Directed duplex demonstrates atherosclerotic changes of the right lower extremity with patent femoropopliteal system,  abnormal flow through the anterior tibial artery, and occlusion of the posterior tibial artery. Signed, Dulcy Fanny. Dellia Nims, RPVI [facility] Electronically Signed   By: Corrie Mckusick D.O.   On: 07/24/2019 09:31    Microbiology: Recent Results (from the past 240 hour(s))  Urine culture     Status: Abnormal   Collection Time: 07/23/19 11:29 PM     Specimen: Urine, Clean Catch  Result Value Ref Range Status   Specimen Description   Final    URINE, CLEAN CATCH Performed at [facility], [address] [place], [place] [address]    Special Requests   Final    NONE Performed at [facility], [place], [place] [address]    Culture 80,000 COLONIES/mL ESCHERICHIA COLI (A)  Final   Report Status 07/26/2019 FINAL  Final   Organism ID, Bacteria ESCHERICHIA COLI (A)  Final      Susceptibility   Escherichia coli - MIC*    AMPICILLIN >=32 RESISTANT Resistant     CEFAZOLIN 8 SENSITIVE Sensitive     CEFTRIAXONE <=1 SENSITIVE Sensitive     CIPROFLOXACIN >=4 RESISTANT Resistant     GENTAMICIN <=1 SENSITIVE Sensitive     IMIPENEM <=0.25 SENSITIVE Sensitive     NITROFURANTOIN <=16 SENSITIVE Sensitive     TRIMETH/SULFA <=20 SENSITIVE Sensitive     AMPICILLIN/SULBACTAM >=32 RESISTANT Resistant     * 80,000 COLONIES/mL ESCHERICHIA COLI  Respiratory Panel by RT PCR (Flu A&B, Covid) - Nasopharyngeal Swab     Status: None   Collection Time: 07/23/19 11:29 PM   Specimen: Nasopharyngeal Swab  Result Value Ref Range Status   SARS Coronavirus 2 by RT PCR NEGATIVE NEGATIVE Final    Comment: (NOTE) SARS-CoV-2 target nucleic acids are NOT DETECTED. The SARS-CoV-2 RNA is generally detectable in upper respiratoy specimens during the acute phase of infection. The lowest concentration of SARS-CoV-2 viral copies this assay can detect is 131 copies/mL. A negative result does not preclude SARS-Cov-2 infection and should not be used as the sole basis for treatment or other patient management decisions. A negative result may occur with  improper specimen collection/handling, submission of specimen other than nasopharyngeal swab, presence of viral mutation(s) within the areas targeted by this assay, and inadequate number of viral copies (<131 copies/mL). A negative result must be combined with clinical observations,  patient history, and epidemiological information. The expected result is Negative. Fact Sheet for Patients:  PinkCheek.be Fact Sheet for Healthcare Providers:  GravelBags.it This test is not yet ap proved or cleared by the [place] FDA and  has been authorized for detection and/or diagnosis of SARS-CoV-2 by FDA under an Emergency Use Authorization (EUA). This EUA will remain  in effect (meaning this test can be used) for the duration of the COVID-19 declaration under Section 564(b)(1) of the Act, 21 U.S.C. section 360bbb-3(b)(1), unless the authorization is terminated or revoked sooner.    Influenza A by PCR NEGATIVE NEGATIVE Final   Influenza B by PCR NEGATIVE NEGATIVE Final    Comment: (NOTE) The Xpert Xpress SARS-CoV-2/FLU/RSV assay is intended as an aid in  the diagnosis of influenza from Nasopharyngeal swab specimens and  should not be used as a sole basis for treatment. Nasal washings and  aspirates are unacceptable for Xpert Xpress SARS-CoV-2/FLU/RSV  testing. Fact Sheet for Patients: PinkCheek.be Fact Sheet for Healthcare Providers: GravelBags.it This test is not yet approved or cleared by the [place] and  has been authorized for detection and/or diagnosis of SARS-CoV-2 by  FDA under an Emergency Use Authorization (EUA). This EUA will remain  in effect (meaning this test can be used) for the duration of the  Covid-19 declaration under Section 564(b)(1) of the Act, 21  U.S.C. section 360bbb-3(b)(1), unless the authorization is  terminated or revoked. Performed at [facility], [address] [place], [place] [address]   MRSA PCR Screening     Status: None   Collection Time: 07/24/19  5:23 PM   Specimen: Nasopharyngeal  Result Value Ref Range Status   MRSA by PCR NEGATIVE NEGATIVE Final    Comment:        The GeneXpert MRSA Assay  (FDA approved for NASAL specimens only), is one component of a comprehensive MRSA colonization surveillance program. It is not intended to diagnose MRSA infection nor to guide or monitor treatment for MRSA infections. Performed at [facility], [address] [place], [place] [address]   CULTURE, BLOOD (ROUTINE X 2) w Reflex to ID Panel     Status: None (Preliminary result)   Collection Time: 07/24/19  6:52 PM   Specimen: BLOOD  Result Value Ref Range Status   Specimen Description BLOOD BLOOD RIGHT WRIST  Final   Special Requests   Final    BOTTLES DRAWN AEROBIC AND ANAEROBIC Blood Culture results may not be optimal due to an inadequate volume of blood received in culture bottles   Culture   Final    NO GROWTH 2 DAYS Performed at [facility], [address] [place], [place] [address]    Report Status PENDING  Incomplete  CULTURE, BLOOD (ROUTINE X 2) w Reflex to ID Panel     Status: None (Preliminary result)   Collection Time: 07/24/19  7:03 PM   Specimen: BLOOD  Result Value Ref Range Status   Specimen Description BLOOD RIGHT ANTECUBITAL  Final   Special Requests   Final    BOTTLES DRAWN AEROBIC AND ANAEROBIC Blood Culture results may not be optimal due to an inadequate volume of blood received in culture bottles   Culture   Final    NO GROWTH 2 DAYS Performed at [facility], [address] [place], [place] [address]    Report Status PENDING  Incomplete     Labs: CBC: Recent Labs  Lab 07/23/19 2151 07/24/19 0201 07/24/19 1850 07/25/19 0609  WBC 11.3* 11.0* 10.5 9.2  NEUTROABS 9.3*  --   --   --   HGB 16.4 15.8 14.3 14.2  HCT 53.3* 52.2* 47.5 46.6  MCV 104.3* 105.2* 106.5* 104.3*  PLT 156 148* 127* 0000000*   Basic Metabolic Panel: Recent Labs  Lab 07/23/19 2151 07/24/19 0201 07/24/19 1850 07/25/19 0010 07/25/19 0609 07/25/19 1002 07/25/19 1620  NA 168* 168* 162* 162* 158* 159* 157*  K 3.7 3.7 3.6  --  3.2* 3.5 3.4*  CL  >130* >130* 129*  --  124* 124* 124*  CO2 26 30 23   --  26 23 25   GLUCOSE 182* 142* 213*  --  198* 194* 126*  BUN 56* 53* 46*  --  41* 38* 35*  CREATININE 1.58* 1.47* 1.40*  --  1.22 1.26* 1.18  CALCIUM 9.2 8.8* 8.5*  --  8.3* 8.4* 8.5*  MG 2.8*  --   --   --  2.7*  --   --    Liver Function Tests: Recent Labs  Lab 07/23/19 2151 07/24/19 0201 07/24/19 1850 07/25/19 [id number]  AST 48* 42* 43* 46*  ALT 64* 56* 50* 51*  ALKPHOS 226* 228* 204* 202*  BILITOT 0.8 1.0 0.9 0.7  PROT 6.5 6.2* 5.8* 5.6*  ALBUMIN 2.9* 2.9* 2.7* 2.6*   No results for input(s): LIPASE, AMYLASE in the last 168 hours. Recent Labs  Lab 07/24/19 1850  AMMONIA 16   Cardiac Enzymes: No results for input(s): CKTOTAL, CKMB, CKMBINDEX, TROPONINI in the last 168 hours. BNP (last 3 results) Recent Labs    07/23/19 2151  BNP 114.0*   CBG: Recent Labs  Lab 07/25/19 0409 07/25/19 0524 07/25/19 0610 07/25/19 0734 07/25/19 0924  GLUCAP 68* 96 193* 65* 44*    Time spent: 35 minutes  Signed:  Berle Mull  [facility]  07/26/2019 4:45 PM

## 2019-07-26 NOTE — TOC Transition Note (Signed)
Transition of Care [facility]) - CM/SW Discharge Note   Patient Details  Name: Kenneth Knight MRN: [id number] Date of Birth: [birth year]  [facility] CM/SW Contact:  Shelbie Hutching, RN Phone Number: 07/26/2019, 1:18 PM   Clinical Narrative:    Patient will discharge to residential hospice tonight.  Patient's daughter Kenneth Knight is at the bedside.  Patient will go via [facility].  Patient will be admitted to hospice on second shift tonight after 8 pm.     Final next level of care: [place] Barriers to Discharge: Barriers Resolved   Patient Goals and CMS Choice   CMS Medicare.gov Compare Post Acute Care list provided to:: Patient Represenative (must comment) Choice offered to / list presented to : Adult Children(daughter Kenneth Knight)  Discharge Placement                       Discharge Plan and Services     Post Acute Care Choice: Hospice                               Social Determinants of Health (SDOH) Interventions     Readmission Risk Interventions No flowsheet data found.

## 2019-07-26 NOTE — Progress Notes (Signed)
EMS arranged for 2045 pick up.  RN will need to call report to the hospice home admission line- [phone].  If no answer, can call the hospice home directly [phone].  Dimas Aguas, RN [facility]

## 2019-07-26 NOTE — Progress Notes (Signed)
Pt is being discharged to hospice home. Called report to Darryl Lent, RN.  Per Arline Asp, hospice RN pick up time at 2030.

## 2019-07-26 NOTE — Progress Notes (Signed)
Patient transported to EMS stretcher. IV and Foley still in place. Patient's vital signs stable. Family at bedside. Patient is off unit and care relenquished.

## 2019-07-26 NOTE — Progress Notes (Signed)
Follow up on new referral for the hospice home.  Patient was transferred out of ICU to room 105.  Spoke with the daughter, Rodena Piety who is in agreement with hospice services and accepts the bed offer.  Patient will plan to transfer to the hospice home this evening with pick up being arranged for 2030 pick up.  Portable DNR to accompany the patient.  Thank you for allowing participation in this patient's care.  Dimas Aguas, RN Clinical Nurse Liaison [facility] [phone]

## 2019-07-29 LAB — CULTURE, BLOOD (ROUTINE X 2)
Culture: NO GROWTH
Culture: NO GROWTH

## 2019-08-05 IMAGING — CT CT CERVICAL SPINE W/O CM
3 of 6 series · 12 of 33 positions shown, 13 images · non-contrast
Comparison: Head CT dated 12/08/2016

CLINICAL DATA: 85-year-old male with fall.

EXAM:
CT HEAD WITHOUT CONTRAST
CT CERVICAL SPINE WITHOUT CONTRAST
TECHNIQUE: Multidetector CT imaging of the head and cervical spine was
performed following the standard protocol without intravenous
contrast. Multiplanar CT image reconstructions of the cervical spine
were also generated.

[Series 5: coronal soft tissue · coronal · 0.28mm/px · 3 of 69 slices shown]
[im 14/69  bone]
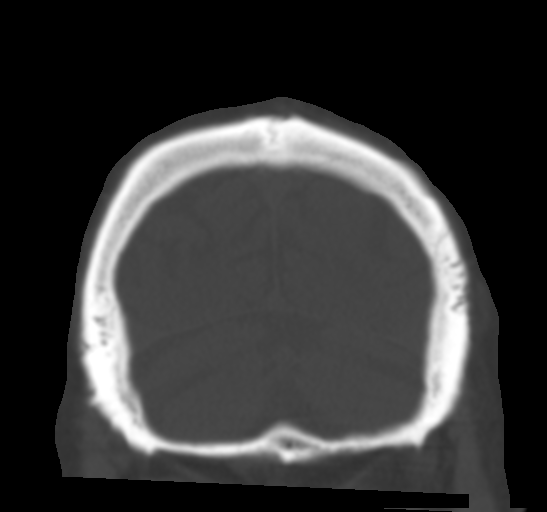
[im 28/69  bone]
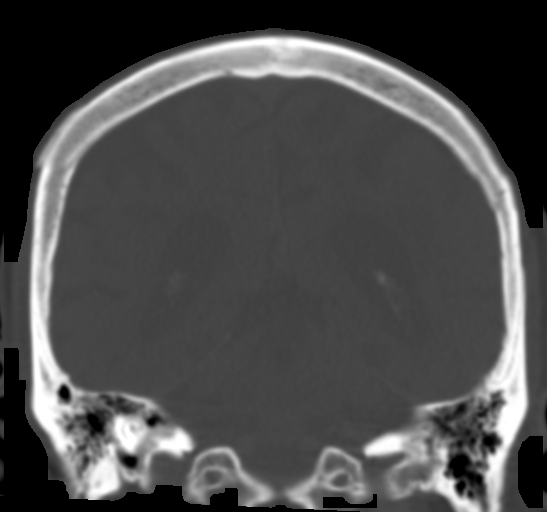
[im 41/69  bone]
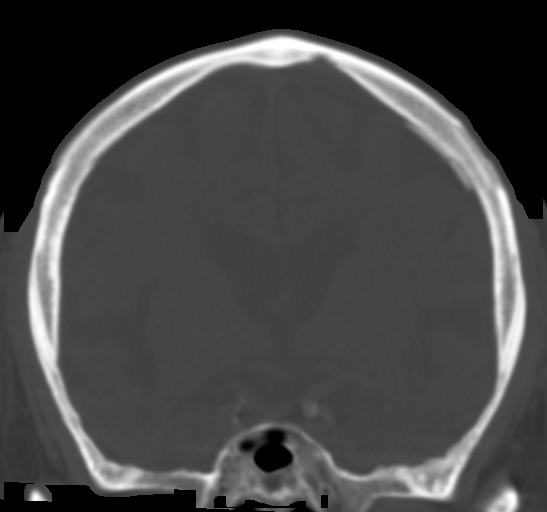

[Series 7: c spine soft · axial · 0.38mm/px · z∈[-222,-126]mm · 4 of 81 slices shown, 5 images]
[im 17/81  soft-tissue]
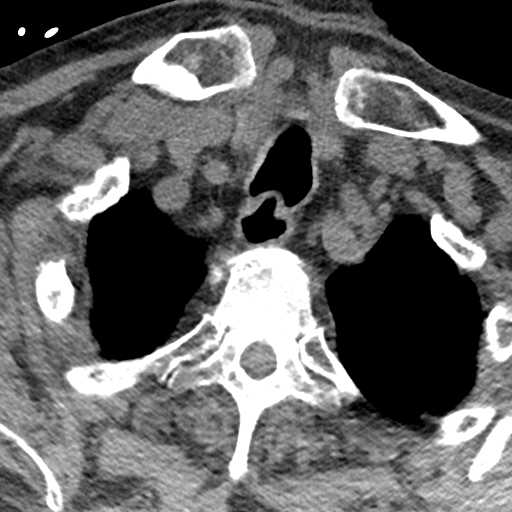
[im 17/81  bone]
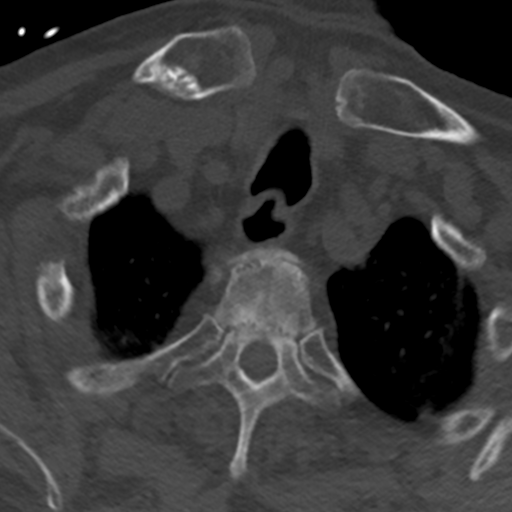
[im 33/81  bone]
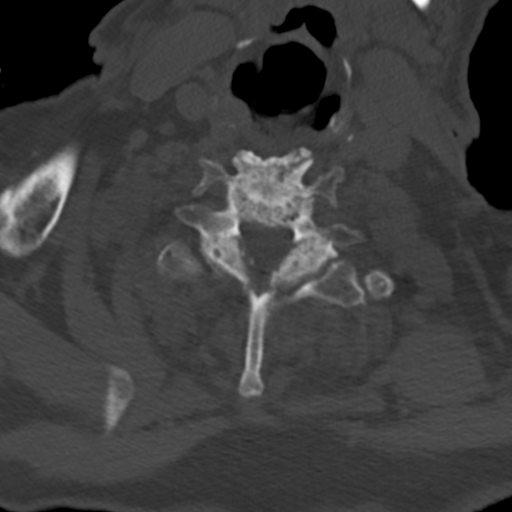
[im 49/81  bone]
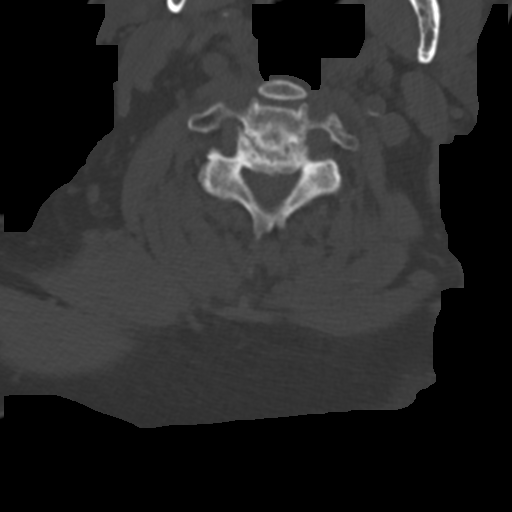
[im 65/81  bone]
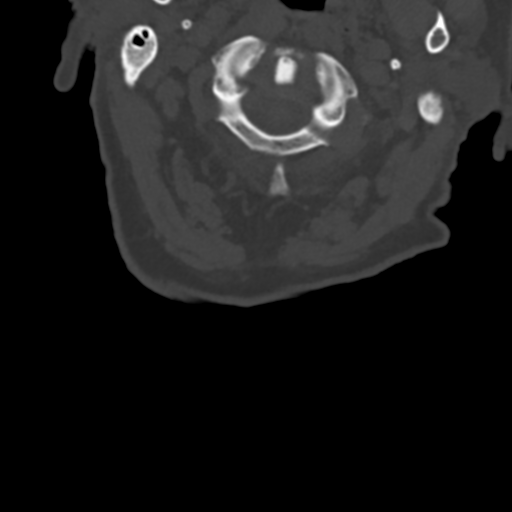

[Series 8: sagittal bone · sagittal · 0.22mm/px · 5 of 68 slices shown]
[im 12/68  bone]
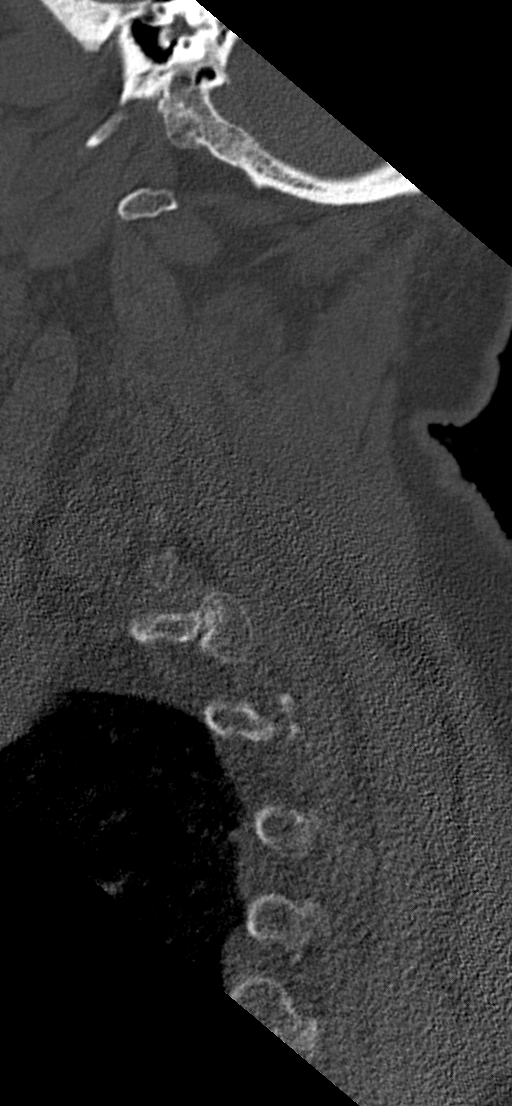
[im 23/68  bone]
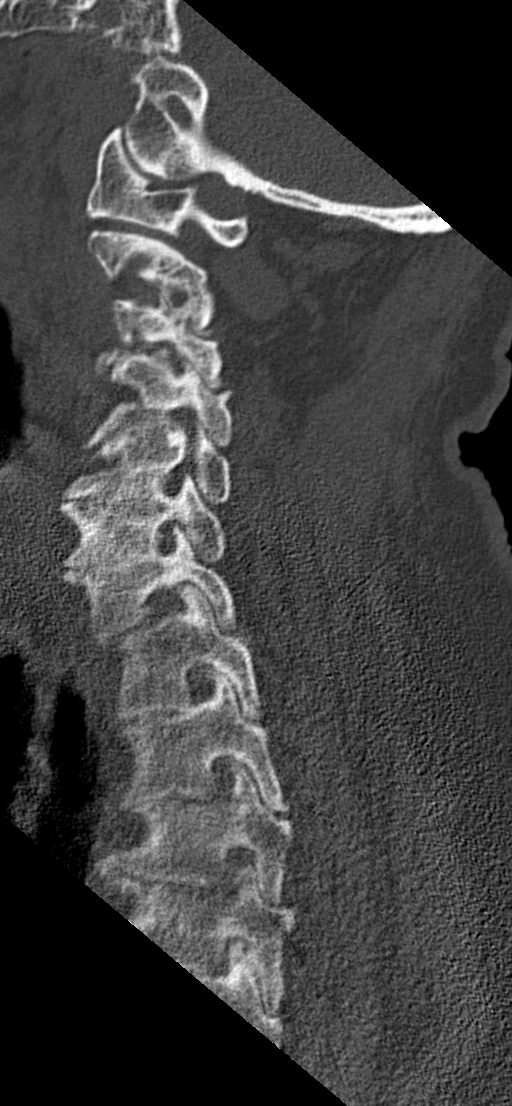
[im 34/68  bone]
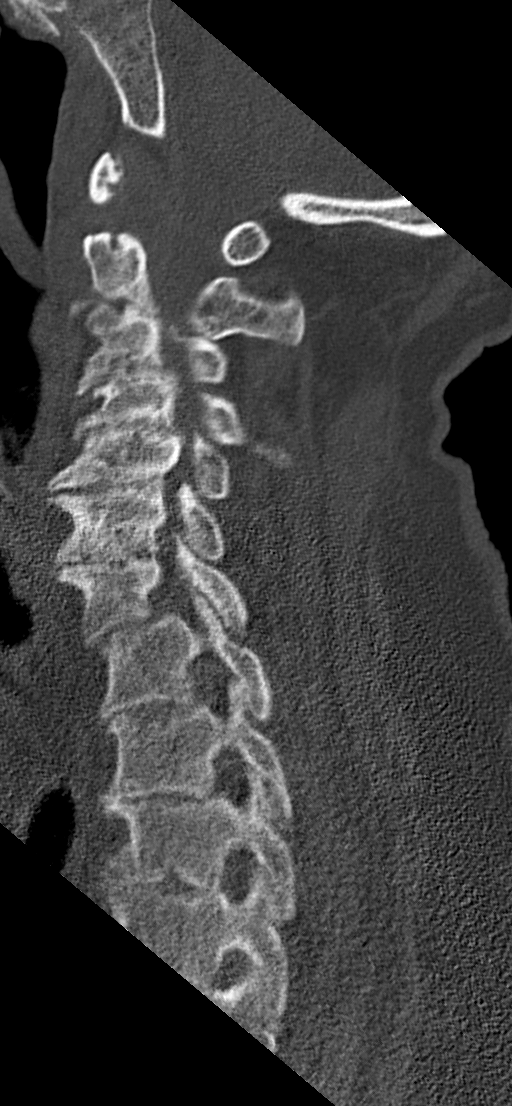
[im 45/68  bone]
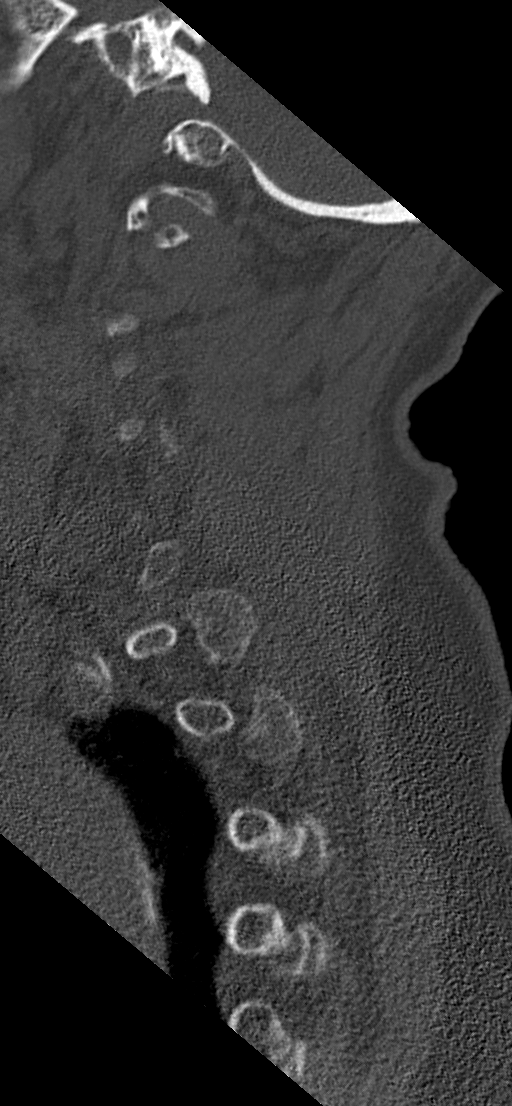
[im 56/68  bone]
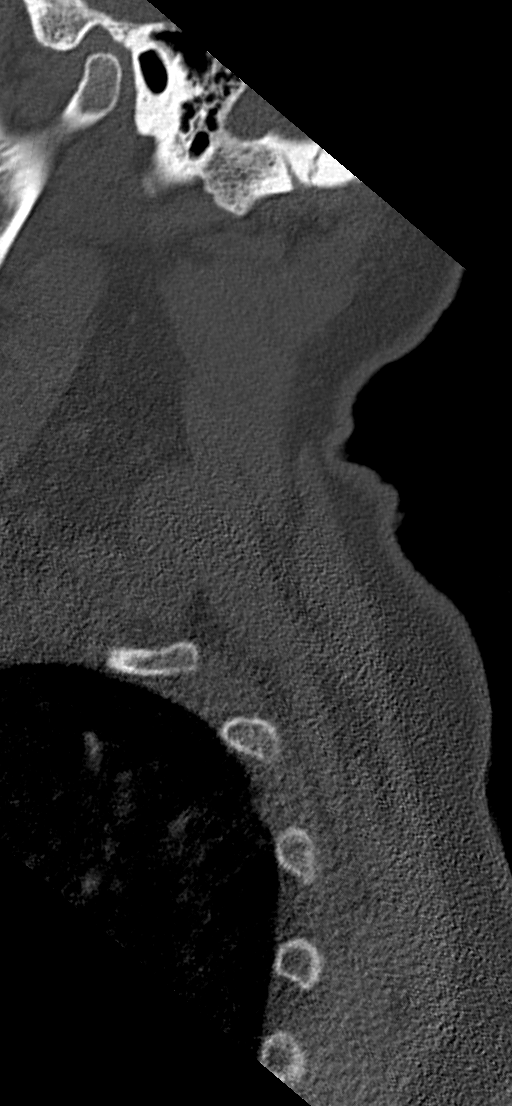

[12 of 33 positions shown; findings below may reference images not displayed]

FINDINGS: Evaluation of this exam is limited due to motion artifact.

CT HEAD FINDINGS

Brain: There is age-related atrophy and chronic microvascular
ischemic changes. Old left parallel infarct. There is no acute
intracranial hemorrhage. No mass effect or midline shift. No
extra-axial fluid collection.

Vascular: No hyperdense vessel or unexpected calcification.

Skull: Normal. Negative for fracture or focal lesion.

Sinuses/Orbits: No acute finding.

Other: None

CT CERVICAL SPINE FINDINGS

Alignment: No acute subluxation. There is grade 1 C7-T1
anterolisthesis. There is reversal of normal cervical lordosis,
likely related to degenerative changes.

Skull base and vertebrae: No acute fracture.

Soft tissues and spinal canal: Mild narrowing of the central canal
secondary to reversal of cervical lordosis. No acute canal hematoma.

Disc levels: Multilevel degenerative disc disease with endplate
irregularity and disc space narrowing and osteophyte.

Upper chest: Negative.

Other: There is atherosclerotic calcification of the aortic arch.
IMPRESSION: 1. No acute intracranial hemorrhage.
2. Age-related atrophy chronic microvascular ischemic changes and
focal old left parietal infarct.
3. No acute/traumatic cervical spine pathology.
4. Multilevel degenerative changes of the spine with reversal of
normal cervical lordosis.

## 2019-08-09 DEATH — deceased

## 2019-08-28 ENCOUNTER — Ambulatory Visit (INDEPENDENT_AMBULATORY_CARE_PROVIDER_SITE_OTHER): Payer: Medicare HMO | Admitting: Nurse Practitioner
